# Patient Record
Sex: Female | Born: 1970
Health system: Southern US, Community
[De-identification: ages and names within clinical notes are randomized; demographics above are authoritative.]

## PROBLEM LIST (undated history)

## (undated) DIAGNOSIS — I10 Essential (primary) hypertension: Secondary | ICD-10-CM

## (undated) DIAGNOSIS — E0500 Thyrotoxicosis with diffuse goiter without thyrotoxic crisis or storm: Secondary | ICD-10-CM

## (undated) HISTORY — DX: Thyrotoxicosis with diffuse goiter without thyrotoxic crisis or storm: E05.00

---

## 1994-01-25 DIAGNOSIS — E0500 Thyrotoxicosis with diffuse goiter without thyrotoxic crisis or storm: Secondary | ICD-10-CM

## 1994-01-25 HISTORY — DX: Thyrotoxicosis with diffuse goiter without thyrotoxic crisis or storm: E05.00

## 1997-06-28 ENCOUNTER — Ambulatory Visit (HOSPITAL_COMMUNITY): Admission: RE | Admit: 1997-06-28 | Discharge: 1997-06-28 | Payer: Self-pay | Admitting: Endocrinology

## 1998-05-07 ENCOUNTER — Emergency Department (HOSPITAL_COMMUNITY): Admission: EM | Admit: 1998-05-07 | Discharge: 1998-05-07 | Payer: Self-pay | Admitting: Emergency Medicine

## 1999-03-11 ENCOUNTER — Emergency Department (HOSPITAL_COMMUNITY): Admission: EM | Admit: 1999-03-11 | Discharge: 1999-03-11 | Payer: Self-pay | Admitting: Emergency Medicine

## 2000-09-16 ENCOUNTER — Emergency Department (HOSPITAL_COMMUNITY): Admission: EM | Admit: 2000-09-16 | Discharge: 2000-09-16 | Payer: Self-pay | Admitting: Emergency Medicine

## 2002-02-13 ENCOUNTER — Encounter: Admission: RE | Admit: 2002-02-13 | Discharge: 2002-02-13 | Payer: Self-pay | Admitting: Family Medicine

## 2006-03-24 DIAGNOSIS — E669 Obesity, unspecified: Secondary | ICD-10-CM | POA: Insufficient documentation

## 2006-03-24 DIAGNOSIS — E0500 Thyrotoxicosis with diffuse goiter without thyrotoxic crisis or storm: Secondary | ICD-10-CM

## 2006-03-24 DIAGNOSIS — I10 Essential (primary) hypertension: Secondary | ICD-10-CM | POA: Insufficient documentation

## 2006-03-24 HISTORY — DX: Thyrotoxicosis with diffuse goiter without thyrotoxic crisis or storm: E05.00

## 2006-06-20 ENCOUNTER — Emergency Department (HOSPITAL_COMMUNITY): Admission: EM | Admit: 2006-06-20 | Discharge: 2006-06-20 | Payer: Self-pay | Admitting: Emergency Medicine

## 2006-11-27 ENCOUNTER — Emergency Department (HOSPITAL_COMMUNITY): Admission: EM | Admit: 2006-11-27 | Discharge: 2006-11-27 | Payer: Self-pay | Admitting: Emergency Medicine

## 2010-06-16 ENCOUNTER — Ambulatory Visit (HOSPITAL_BASED_OUTPATIENT_CLINIC_OR_DEPARTMENT_OTHER): Payer: Self-pay | Attending: Internal Medicine

## 2010-06-16 DIAGNOSIS — G4733 Obstructive sleep apnea (adult) (pediatric): Secondary | ICD-10-CM | POA: Insufficient documentation

## 2010-06-21 DIAGNOSIS — R0989 Other specified symptoms and signs involving the circulatory and respiratory systems: Secondary | ICD-10-CM

## 2010-06-21 DIAGNOSIS — G4733 Obstructive sleep apnea (adult) (pediatric): Secondary | ICD-10-CM

## 2010-06-21 DIAGNOSIS — R0609 Other forms of dyspnea: Secondary | ICD-10-CM

## 2010-06-22 NOTE — Procedures (Signed)
[name], [name] NO.:  [id number]  MEDICAL RECORD NO.:  [id number]          PATIENT TYPE:  OUT  LOCATION:  SLEEP CENTER                 FACILITY:  [facility]  PHYSICIAN:  Micheline Markes D. Maple Hudson, MD, FCCP, FACPDATE OF BIRTH:  1970/07/15  DATE OF STUDY:  06/16/2010                           NOCTURNAL POLYSOMNOGRAM  REFERRING PHYSICIAN:  EDWIN A AVBUERE  INDICATION FOR STUDY:  Hypersomnia with sleep apnea.  EPWORTH SLEEPINESS SCORE:  11/24, BMI 51.5.  Weight 300 pounds, height 64 inches.  Neck 16 inches.  MEDICATIONS:  Home medications are charted and reviewed.  SLEEP ARCHITECTURE:  Split study protocol.  During the diagnostic phase, total sleep time 117 minutes with sleep efficiency 73.4%.  Stage I was 27.4%, stage II was 62.8%, stage III was absent, REM 9.8% of total sleep time.  Sleep latency 8 minutes, REM latency 131.5 minutes, awake after sleep onset 34.5 minutes, arousal index 76.4.  Bedtime medication: None.  RESPIRATORY DATA:  Split study protocol.  Apnea-hypopnea index (AHI) 120.5 per hour.  A total of 235 events was scored including 185 obstructive apneas, one mixed apnea of 49 hypopneas.  Events were associated with non supine sleep.  REM AHI 120.  CPAP was then titrated to 15 CWP, AHI one per hour.  She wore a small ResMed Mirage Quattro full-face mask with heated humidifier and a C-Flex setting of 3.  OXYGEN DATA:  Before CPAP snoring was loud with oxygen desaturation to a nadir of 57% on room air.  With CPAP titration mean oxygen saturation was still only 89% with snoring prevented.  CARDIAC DATA:  Sinus rhythm with PVCs.  MOVEMENT-PARASOMNIA:  No significant movement disturbance.  Bathroom x1.  IMPRESSIONS-RECOMMENDATIONS: 1. Severe obstructive sleep apnea/hypopnea syndrome, apnea-hypopnea     index 120.5 per hour.  Non-supine events with loud snoring and     oxygen desaturation to a nadir of 57% on room air. 2. Successful continuous positive  airway pressure titration to 15 mL     centimeters of water pressure, apnea-hypopnea index 2 per hour.     She wore small ResMed Mirage Quattro full-face mask with heated     humidifier and C-Flex of 3. 3. Mean oxygen saturation while titrating CPAP remained low at 89.9%.     This was brought down by desaturation during apneas in the early     part of the titration phase.  Once the patient is established at     home on CPAP,     consider an overnight oximetry on continuous positive airway     pressure with room air to determine if oxygenation is adequate.     Wessley Emert D. Maple Hudson, MD, [facility], FACP Diplomate, Biomedical engineer of Sleep Medicine Electronically Signed    CDY/MEDQ  D:  06/21/2010 14:42:13  T:  06/22/2010 00:25:21  Job:  [id number]

## 2011-07-27 ENCOUNTER — Encounter: Payer: Self-pay | Admitting: Family Medicine

## 2011-07-27 ENCOUNTER — Ambulatory Visit (INDEPENDENT_AMBULATORY_CARE_PROVIDER_SITE_OTHER): Payer: Self-pay | Admitting: Family Medicine

## 2011-07-27 VITALS — BP 147/102 | HR 74 | Ht 64.0 in | Wt 320.7 lb

## 2011-07-27 DIAGNOSIS — E669 Obesity, unspecified: Secondary | ICD-10-CM

## 2011-07-27 DIAGNOSIS — I10 Essential (primary) hypertension: Secondary | ICD-10-CM

## 2011-07-27 DIAGNOSIS — G4730 Sleep apnea, unspecified: Secondary | ICD-10-CM

## 2011-07-27 DIAGNOSIS — E0500 Thyrotoxicosis with diffuse goiter without thyrotoxic crisis or storm: Secondary | ICD-10-CM

## 2011-07-27 LAB — COMPREHENSIVE METABOLIC PANEL
AST: 15 U/L (ref 0–37)
Albumin: 4.1 g/dL (ref 3.5–5.2)
BUN: 7 mg/dL (ref 6–23)
Calcium: 9.6 mg/dL (ref 8.4–10.5)
Chloride: 100 mEq/L (ref 96–112)
Glucose, Bld: 78 mg/dL (ref 70–99)
Potassium: 4.6 mEq/L (ref 3.5–5.3)

## 2011-07-27 LAB — CBC
Hemoglobin: 15.8 g/dL — ABNORMAL HIGH (ref 12.0–15.0)
MCHC: 33.8 g/dL (ref 30.0–36.0)

## 2011-07-27 MED ORDER — HYDROCHLOROTHIAZIDE 25 MG PO TABS: 25.0000 mg | ORAL_TABLET | Freq: Every day | ORAL | Status: DC

## 2011-07-27 NOTE — Patient Instructions (Addendum)
Dear Mrs. Stankowski,   Thank you for coming to clinic today. Please read below regarding the issues that we discussed.   1. Sleep Apnea - We may be able to get sponsorship through the vendor. I am getting your records from the Sleep Center and will then make a referral to [facility].   2. Graves Disease - We will check some labs today. This will help [place] to know if your thyroid is still working.   3. High Blood Pressure - Please start taking HCTZ 25 mg daily. Continue propranolol. We will re-check your blood pressure at the next visit.   Please follow up in clinic in 2 weeks . Please call earlier if you have any questions or concerns.   Sincerely,   Dr. Clinton Sawyer

## 2011-07-27 NOTE — Progress Notes (Signed)
  Subjective:    Patient ID: Molly White, female    DOB: January 21, 1971, 41 y.o.   MRN: [id number]  HPI  HTN - Currently taking HCTZ and propranolol; never missed a dose - recently prescribed by one time clinic  Graves Disease s/p Radioactive Ablation - never taken synthroid, was told that ablation was not completely effective  Sleep Apnea - diagnosed recently by [facility], not using CPAP 2/2 financial problems, frequent night time wakening and persistent daytime sleepiness, claims to have fallen asleep in a job interview  Goals of Care: 1. Care of thyroid 2. Care of sleep apnea - not using CPAP - Most important  3. Control Blood Pressure 4. Reduce Weight 5. Smoking Cessations - 0.5 ppd for 13-15 years; has tried several times to quit "cold-turkey"  Review of Systems Positive for daytime fatigue, polyuria, polydipsia  Negative for palpaitations, hair loss, tremulous    Objective:   Physical Exam BP 147/102  Pulse 74  Ht 5\' 4"  (1.626 m)  Wt 320 lb 11.2 oz (145.469 kg)  BMI 55.05 kg/m2 Gen: AAF, morbidly obese, alert and oriented, non distressed, very pleasant HEENT: Exophthalmos, no palpable goiter CV: RRR, distant heart sounds Lungs: CTA - B      Assessment & Plan:  41 y.o. F with uncontrolled HTN possible complicated by sleep apnea, obesity, and Graves disease.

## 2011-07-27 NOTE — Assessment & Plan Note (Signed)
Patient reported that she was diagnosed last year at [facility]. Obtain records. Fax referral to [facility] to see if vendor sponsorship for CPAP possible.

## 2011-07-27 NOTE — Assessment & Plan Note (Signed)
Likely primary and secondary HTN from sleep apnea. Increase HCTZ to 25 mg daily. Continue propranolol 80 mg daily for now. Recheck BP in 2 weeks.

## 2011-07-27 NOTE — Assessment & Plan Note (Addendum)
Diagnosed 1996. S/p radio ablation x 2. Patient told that ablation was not completely effective. Current thyroid status unknown as of 07/27/11. Check TSH, CMET, CBC.

## 2011-07-27 NOTE — Assessment & Plan Note (Signed)
Patient would like to lose weight. Discuss in further detail at upcoming visits.

## 2011-08-04 ENCOUNTER — Encounter: Payer: Self-pay | Admitting: Family Medicine

## 2011-08-04 ENCOUNTER — Other Ambulatory Visit: Payer: Self-pay | Admitting: Family Medicine

## 2011-08-04 DIAGNOSIS — G4733 Obstructive sleep apnea (adult) (pediatric): Secondary | ICD-10-CM

## 2011-10-27 ENCOUNTER — Other Ambulatory Visit: Payer: Self-pay | Admitting: Family Medicine

## 2012-02-03 ENCOUNTER — Other Ambulatory Visit: Payer: Self-pay | Admitting: *Deleted

## 2012-02-04 ENCOUNTER — Encounter: Payer: Self-pay | Admitting: Family Medicine

## 2012-02-04 ENCOUNTER — Ambulatory Visit (INDEPENDENT_AMBULATORY_CARE_PROVIDER_SITE_OTHER): Payer: Self-pay | Admitting: Family Medicine

## 2012-02-04 VITALS — BP 126/84 | HR 101 | Temp 98.0°F | Ht 64.0 in | Wt 333.0 lb

## 2012-02-04 DIAGNOSIS — E669 Obesity, unspecified: Secondary | ICD-10-CM

## 2012-02-04 DIAGNOSIS — G4730 Sleep apnea, unspecified: Secondary | ICD-10-CM

## 2012-02-04 DIAGNOSIS — I10 Essential (primary) hypertension: Secondary | ICD-10-CM

## 2012-02-04 MED ORDER — PROPRANOLOL HCL 80 MG PO TABS: 80.0000 mg | ORAL_TABLET | Freq: Every day | ORAL | Status: DC

## 2012-02-04 NOTE — Patient Instructions (Addendum)
NICE TO MEET YOU. I have refilled your propranolol. Will send you to nutrition referral. Do you best with starting exercise routine. 20-30 minutes with goal of 5 days per week. Please make appointment with Dr. Clinton Sawyer to check up on sleep apnea.

## 2012-02-06 NOTE — Progress Notes (Signed)
  Subjective:    Patient ID: Molly White, female    DOB: 1970-02-12, 42 y.o.   MRN: [id number]  HPI  1. HTN. Ran out of propranolol today and needs refill. Does not check BP at home consistently. Still has HCTZ.  2. Obesity. She understands her weight and sleep apnea are contributing, and is interested in trying new methods of weight loss bc she continues steady gain without much physical activity.  3. OSA. Using CPAP and feels her fatigue is improved. She requests to know if her readings/apneas have improved with the therapy. [facility] has not sent any compliance reports in Epic.  Review of Systems  Denies headaches, visual changes, rash, polyuria, polydipsia.No dyspnea, edema, chest pain, thyroid swelling, palpitations.     Objective:   Physical Exam  Vitals reviewed. Constitutional: She is oriented to person, place, and time. She appears well-nourished. No distress.       Morbid obese  HENT:  Head: Normocephalic and atraumatic.  Mouth/Throat: Oropharynx is clear and moist. No oropharyngeal exudate.  Eyes: EOM are normal. Pupils are equal, round, and reactive to light.  Neck: Normal range of motion. Neck supple. No thyromegaly present.  Cardiovascular: Normal rate, regular rhythm and normal heart sounds.   Pulmonary/Chest: Effort normal and breath sounds normal. No respiratory distress. She has no wheezes.  Musculoskeletal: She exhibits no edema.  Neurological: She is alert and oriented to person, place, and time. Coordination normal.  Skin: No rash noted. She is not diaphoretic.  Psychiatric: She has a normal mood and affect.        Assessment & Plan:

## 2012-02-06 NOTE — Assessment & Plan Note (Signed)
At goal today. Refilled propranolol. Patient prefers to wait for lab work, normal last visit so agree to postpone but needs glucose, TSH, cr next visit.

## 2012-02-06 NOTE — Assessment & Plan Note (Signed)
Subjective improvement with CPAP handled by [facility]. Perhaps we can get an update or compliance report from [facility] to have more details for patient with she follow up with PCP.

## 2012-02-06 NOTE — Assessment & Plan Note (Signed)
Deteriorated with gain of 13 lbs in past 6 months. Most likely from sedentary lifestyle and overeating. Will make nutrition referral. Encouraged starting exercise at least 20-30 minutes working up to 5 times weekly. F/u with PCP in 1-2 months.

## 2012-02-07 ENCOUNTER — Ambulatory Visit: Payer: Self-pay | Admitting: Family Medicine

## 2012-02-21 ENCOUNTER — Ambulatory Visit: Payer: Self-pay | Admitting: *Deleted

## 2012-04-24 ENCOUNTER — Other Ambulatory Visit: Payer: Self-pay | Admitting: Family Medicine

## 2012-04-25 ENCOUNTER — Other Ambulatory Visit: Payer: Self-pay | Admitting: *Deleted

## 2012-04-25 DIAGNOSIS — I10 Essential (primary) hypertension: Secondary | ICD-10-CM

## 2012-04-26 MED ORDER — PROPRANOLOL HCL 80 MG PO TABS: 80.0000 mg | ORAL_TABLET | Freq: Every day | ORAL | Status: DC

## 2012-04-28 ENCOUNTER — Other Ambulatory Visit: Payer: Self-pay | Admitting: Family Medicine

## 2012-06-22 ENCOUNTER — Ambulatory Visit (INDEPENDENT_AMBULATORY_CARE_PROVIDER_SITE_OTHER): Payer: BC Managed Care – PPO | Admitting: Family Medicine

## 2012-06-22 ENCOUNTER — Encounter: Payer: Self-pay | Admitting: Family Medicine

## 2012-06-22 DIAGNOSIS — M25572 Pain in left ankle and joints of left foot: Secondary | ICD-10-CM

## 2012-06-22 DIAGNOSIS — M25579 Pain in unspecified ankle and joints of unspecified foot: Secondary | ICD-10-CM

## 2012-06-22 DIAGNOSIS — E669 Obesity, unspecified: Secondary | ICD-10-CM

## 2012-06-22 NOTE — Progress Notes (Signed)
  Subjective:    Patient ID: Molly White, female    DOB: Mar 06, 1970, 42 y.o.   MRN: [id number]  HPI  42 year old F with morbid obesity and obstructive sleep apnea who presents for follow up on weight loss and left ankle pain.  Obesity:  Wt Readings from Last 5 Encounters:  06/22/12 326 lb (147.873 kg)  02/04/12 333 lb (151.048 kg)  07/27/11 320 lb 11.2 oz (145.469 kg)   History - Had been obese her entore adult life, had significant weight gain (60 lbs) when she became depressed after the death of her brother. He died of a heart attack in the post-operative setting after bariatric surgery. Therefore, the patient will not consider bariatric surgery as a weight loss option.   Ideal Body Weight: Per patient 200 lbs  Eating Habits -  Risky: 1 soda per day, really likes chocolate, eating after 8 PM  Healthy: really likes salad with kale, spinach, and fruits; avoiding carbohydrates at night, packing   24 hours food diary: Breakfast - yogurt, apple, 1-2 boiled eggs Lunch - soda (12 ounce regular), sandwich, fruit  Dinner - chicken, rice  Snacks - fruit, almonds, trail mix   Exercise - Patient was walking routinely before work, but did not like being outside alone at [place], so her walking has tapered off, will restart now that it is warmer and sunny outside  Left Ankle Pain: Location: left lateral ankle Duration: Since March, patient "turned" ankle while walking downstairs Course: no imaging or intervention at the time, pain has improved but still persistent Alleviating Factors: rest Exacerbating Factors: walking History of previous injury: no  Review of Systems Improved energy from sing CPAP      Objective:   Physical Exam BP 112/80  Pulse 80  Temp(Src) 98.6 F (37 C) (Oral)  Ht 5\' 4"  (1.626 m)  Wt 326 lb (147.873 kg)  BMI 55.93 kg/m2 Gen: AAF, appears stated aged, morbidly obese, very pleasant HEENT: NCAT, mild exophthalmos, short thick neck Left Ankle: normal  appearing with no bone deformities, mild trace edema around left lateral malleolus without ecchymosis, normal dorsal and plantar flexion, tenderness to palpation superior to malleoli      Assessment & Plan:  > 25 minutes spent with direct patient care and counseling

## 2012-06-22 NOTE — Patient Instructions (Addendum)
Thank you for coming to see me today.  Regarding weight loss - The thing that we discussed that you do is extremely well are to eat fruit, vegetables, nuts, eggs, and bringing your food to work. The things that we can work on are eating before 8:00 PM and reducing the Coke to 8 ounce instead of 12 ounce at lunch. I think it is reasonable to have 2-3 pound weight loss in the next month.   For the annual physical in July, come get your blood work done and then schedule an appointment with me 1 week later.   Take Care,   Dr. Clinton Sawyer

## 2012-06-25 DIAGNOSIS — M25572 Pain in left ankle and joints of left foot: Secondary | ICD-10-CM | POA: Insufficient documentation

## 2012-06-25 NOTE — Assessment & Plan Note (Signed)
Differential diagnosis includes ankle sprain vs fracture with sprain being most likely. However, I will obtain a left ankle X-ray to rule out fibula fracture given location of tenderness.

## 2012-06-25 NOTE — Assessment & Plan Note (Signed)
Patient has improved weight since last visit with 7 lb weight loss. Based upon the patient's desired weight of 250 pounds, I explained that bariatric surgery would likely be the most effective options, which she understandably refused due to her brother unfortunate death after a bariatric procedure. The patient has very good knowledge of healthy eating and very healthy eating habits that she shared with me. Additionally, she was able to recognize several modifiable eating habits to improve, specifically eating after 8PM and reducing the amount of soda from 12 ounces to 8 ounces daily. She also needs to improve her exercise regimen. Goal weight loss 2-3 pounds in next month. Patient in agreement with plan.

## 2012-07-13 ENCOUNTER — Telehealth: Payer: Self-pay | Admitting: Family Medicine

## 2012-07-13 NOTE — Telephone Encounter (Signed)
Patient has an irritation under her breast.  A friend let her try some of his prescription jock itch cream and it helped so she would like Dr. Clinton Sawyer to send her an Rx to [facility].

## 2012-07-13 NOTE — Telephone Encounter (Signed)
Pt told she needs an appt for evaluation.  Pt verbalized understanding.  Tymira Horkey, Darlyne Russian, CMA

## 2012-07-13 NOTE — Telephone Encounter (Signed)
Pt notified.  Leida Luton L, CMA  

## 2012-07-13 NOTE — Telephone Encounter (Signed)
Please tell patient that she can purchase over the counter medication for jock-itch that may work. So she should try that before she comes for a visit.

## 2012-09-21 ENCOUNTER — Other Ambulatory Visit: Payer: BC Managed Care – PPO

## 2012-09-21 LAB — CBC
HCT: 41 % (ref 36.0–46.0)
Hemoglobin: 14.1 g/dL (ref 12.0–15.0)
MCH: 31.4 pg (ref 26.0–34.0)
MCHC: 34.4 g/dL (ref 30.0–36.0)
RDW: 14 % (ref 11.5–15.5)

## 2012-09-21 LAB — LIPID PANEL
LDL Cholesterol: 130 mg/dL — ABNORMAL HIGH (ref 0–99)
VLDL: 24 mg/dL (ref 0–40)

## 2012-09-21 LAB — BASIC METABOLIC PANEL
CO2: 29 mEq/L (ref 19–32)
Chloride: 101 mEq/L (ref 96–112)
Potassium: 3.7 mEq/L (ref 3.5–5.3)
Sodium: 138 mEq/L (ref 135–145)

## 2012-09-21 NOTE — Progress Notes (Signed)
BMP,CBC,FLP AND TSH DONE TODAY Molly White

## 2012-09-22 ENCOUNTER — Encounter: Payer: Self-pay | Admitting: Family Medicine

## 2012-10-18 ENCOUNTER — Other Ambulatory Visit: Payer: Self-pay | Admitting: Family Medicine

## 2012-10-26 ENCOUNTER — Encounter: Payer: Self-pay | Admitting: Emergency Medicine

## 2012-10-26 ENCOUNTER — Telehealth: Payer: Self-pay | Admitting: Family Medicine

## 2012-10-26 ENCOUNTER — Ambulatory Visit (INDEPENDENT_AMBULATORY_CARE_PROVIDER_SITE_OTHER): Payer: BC Managed Care – PPO | Admitting: Emergency Medicine

## 2012-10-26 VITALS — BP 122/83 | HR 82 | Temp 98.1°F | Ht 64.0 in | Wt 306.0 lb

## 2012-10-26 DIAGNOSIS — J069 Acute upper respiratory infection, unspecified: Secondary | ICD-10-CM

## 2012-10-26 DIAGNOSIS — B372 Candidiasis of skin and nail: Secondary | ICD-10-CM

## 2012-10-26 DIAGNOSIS — F17200 Nicotine dependence, unspecified, uncomplicated: Secondary | ICD-10-CM

## 2012-10-26 DIAGNOSIS — Z720 Tobacco use: Secondary | ICD-10-CM | POA: Insufficient documentation

## 2012-10-26 MED ORDER — GUAIFENESIN ER 600 MG PO TB12: 1200.0000 mg | ORAL_TABLET | Freq: Two times a day (BID) | ORAL | Status: DC

## 2012-10-26 MED ORDER — CLOTRIMAZOLE 1 % EX CREA: TOPICAL_CREAM | Freq: Two times a day (BID) | CUTANEOUS | Status: DC

## 2012-10-26 MED ORDER — DOXYCYCLINE HYCLATE 100 MG PO TABS: 100.0000 mg | ORAL_TABLET | Freq: Two times a day (BID) | ORAL | Status: DC

## 2012-10-26 MED ORDER — AMOXICILLIN 875 MG PO TABS: 875.0000 mg | ORAL_TABLET | Freq: Two times a day (BID) | ORAL | Status: DC

## 2012-10-26 NOTE — Assessment & Plan Note (Signed)
Has not smoked since this started on Saturday. Encouraged continued cessation.

## 2012-10-26 NOTE — Patient Instructions (Addendum)
It was nice to meet you!  You likely caught a virus from one of your friends. Since you smoke and are having so much mucous, I sent in a prescription for an antibiotic called Doxycycline.  Take 1 pill twice a day for 10 days. Also take Mucinex 600mg  twice a day. Take a teaspoon of honey as needed to help with cough.  Use the clotrimazole cream on the affected areas twice a day.  Follow up with Dr. Clinton Sawyer as scheduled next week.

## 2012-10-26 NOTE — Telephone Encounter (Signed)
Will send in amoxicillin instead.  One pill twice a day for 10 days.

## 2012-10-26 NOTE — Assessment & Plan Note (Addendum)
Suspect viral given history and exam.  No red flags for pneumonia. Will give doxycycline given her smoking history and productive cough - a long the lines of COPD exacerbation. Discussed symptomatic treatment for cough with honey prn. Follow up as scheduled with Dr. Clinton Sawyer next week.

## 2012-10-26 NOTE — Assessment & Plan Note (Signed)
Under breasts and in groin. Likely secondary to body habitus. Discussed importance of keeping these areas dry. Clotrimazole cream BID until cleared.

## 2012-10-26 NOTE — Telephone Encounter (Signed)
Will fwd to MD.  Roxas Clymer L, CMA  

## 2012-10-26 NOTE — Telephone Encounter (Signed)
Pt received antibotic today but when she went to have ti filled it cost $42 with insurance She would like something cheaper. Please advise

## 2012-10-26 NOTE — Progress Notes (Signed)
  Subjective:    Patient ID: Molly White, female    DOB: 10/05/1970, 42 y.o.   MRN: [id number]  HPI Welda Azzarello is here for a SDA for cough.  Cough She reports a cough for the last 5 days.  It started with an itchy throat that progressed to a sore throat and cough.  Also with chest congestion.  Cough is productive of green mucous.  Coughing hard enough to cause some urinary stress incontinence.  Denies any nasal symptoms, fevers, shortness of breath or headaches.  Does endorse fatigue and decreased appetite.  No nausea, vomiting, diarrhea.  + sick contacts.  Rash She describes an itchy rash under her breasts and in her groin.  It has been present for at least 1 week.  Has had something similar in the past that cleared up with clotrimazole cream.  I have reviewed and updated the following as appropriate: allergies and current medications SHx: current smoker  Review of Systems See HPI    Objective:   Physical Exam BP 122/83  Pulse 82  Temp(Src) 98.1 F (36.7 C) (Oral)  Ht 5\' 4"  (1.626 m)  Wt 306 lb (138.801 kg)  BMI 52.5 kg/m2  SpO2 96% Gen: alert, cooperative, NAD HEENT: AT/[place], sclera white, MMM, no pharyngeal erythema or exudate, no nasal discharge Neck: supple, no LAD CV: RRR, no murmurs Pulm: good air movement; scattered rhonchi, no wheezing Skin: erythematous rash in skin folds under the breasts     Assessment & Plan:

## 2012-10-27 NOTE — Telephone Encounter (Signed)
Pt notified.  Palmina Clodfelter L, CMA  

## 2012-11-01 ENCOUNTER — Ambulatory Visit (INDEPENDENT_AMBULATORY_CARE_PROVIDER_SITE_OTHER): Payer: BC Managed Care – PPO | Admitting: Family Medicine

## 2012-11-01 ENCOUNTER — Encounter: Payer: Self-pay | Admitting: Family Medicine

## 2012-11-01 ENCOUNTER — Other Ambulatory Visit: Payer: Self-pay

## 2012-11-01 VITALS — BP 142/99 | HR 87 | Temp 98.3°F | Ht 64.0 in | Wt 310.0 lb

## 2012-11-01 DIAGNOSIS — E0500 Thyrotoxicosis with diffuse goiter without thyrotoxic crisis or storm: Secondary | ICD-10-CM

## 2012-11-01 DIAGNOSIS — Z720 Tobacco use: Secondary | ICD-10-CM

## 2012-11-01 DIAGNOSIS — E669 Obesity, unspecified: Secondary | ICD-10-CM

## 2012-11-01 DIAGNOSIS — Z1231 Encounter for screening mammogram for malignant neoplasm of breast: Secondary | ICD-10-CM

## 2012-11-01 DIAGNOSIS — F17200 Nicotine dependence, unspecified, uncomplicated: Secondary | ICD-10-CM

## 2012-11-01 DIAGNOSIS — Z23 Encounter for immunization: Secondary | ICD-10-CM

## 2012-11-01 MED ORDER — TETANUS-DIPHTH-ACELL PERTUSSIS 5-2.5-18.5 LF-MCG/0.5 IM SUSP: 0.5000 mL | Freq: Once | INTRAMUSCULAR | Status: DC

## 2012-11-01 NOTE — Patient Instructions (Signed)
Dear Molly White,   Thank you for coming to clinic today. Please read below regarding the issues that we discussed.   1. Smoking - I am glad you're planning to quit smoking. Please consider using nicotine replacement or medication if necessary. Also suggested he call the quit line. I'll give you the contact information.  2. Cervical Cancer Screening - Please followup in the next few weeks for a Pap smear.  3. Weight Loss - Way to go! Keep up the exercise. I am very proud of you.   4. Lab Results - As I mentioned, everything was normal.   5. Mammogram - Please call the breast center to set up a mammogram.   Please follow up in clinic in 2 weeks. Please call earlier if you have any questions or concerns.   Sincerely,   Dr. Clinton Sawyer

## 2012-11-02 NOTE — Progress Notes (Signed)
  Subjective:    Patient ID: Molly White, female    DOB: 1970/03/31, 42 y.o.   MRN: [id number]  HPI  Patient presents for follow up of recent lab work from 09/21/12. Specifically, we reviewed CBC, BMP, Lipid Profile and TSH and significance of each lab. No labs warranted further evaluation or treatment.   Obesity - Patient attempting to lose weight through exercise. Improvements noted below. Patient very pleased with progress.   Wt Readings from Last 5 Encounters:  11/01/12 310 lb (140.615 kg)  10/26/12 306 lb (138.801 kg)  06/22/12 326 lb (147.873 kg)  02/04/12 333 lb (151.048 kg)  07/27/11 320 lb 11.2 oz (145.469 kg)    Tobacco Use - patient currently smoking 1/4 - 1/2 ppd. She would like to quit. 10/10 on scale of importance and 5/10 scale of confidence that she can quit; no quit date set; strategy is reduction in number until she stops completely; no hx of other interventions like couseling, nicotine replacement, and medication  Review of Systems Negative for cough, fever, chest pain, SOB    Objective:   Physical Exam BP 142/99  Pulse 87  Temp(Src) 98.3 F (36.8 C) (Oral)  Ht 5\' 4"  (1.626 m)  Wt 310 lb (140.615 kg)  BMI 53.19 kg/m2  SpO2 94% Gen: morbidly obese AAF, pleasant and conversant CV: RRR, no murmurs rubs or gallops Pulm: normal WOB CTA-B       Assessment & Plan:

## 2012-11-03 NOTE — Assessment & Plan Note (Signed)
Counseled on importance of choosing a quit strategy and quit date. Also given information about [facility] QUIT Line and will contact them.

## 2012-11-03 NOTE — Assessment & Plan Note (Signed)
TSH WNL so no evidence of hyperthyroidism in labs or clinically. Continue current course.

## 2012-11-03 NOTE — Assessment & Plan Note (Signed)
Assessment: still morbidly obese but improved since May by 16 lbs Plan: Continue regular exercise for goal weight loss of 1-2lbs per month

## 2012-11-22 ENCOUNTER — Ambulatory Visit
Admission: RE | Admit: 2012-11-22 | Discharge: 2012-11-22 | Disposition: A | Discharge status: Home / Self Care | Payer: BC Managed Care – PPO | Source: Ambulatory Visit

## 2012-11-22 DIAGNOSIS — Z1231 Encounter for screening mammogram for malignant neoplasm of breast: Secondary | ICD-10-CM

## 2012-12-29 ENCOUNTER — Ambulatory Visit (INDEPENDENT_AMBULATORY_CARE_PROVIDER_SITE_OTHER): Payer: BC Managed Care – PPO | Admitting: Family Medicine

## 2012-12-29 ENCOUNTER — Encounter: Payer: Self-pay | Admitting: Family Medicine

## 2012-12-29 VITALS — BP 129/74 | HR 79 | Temp 99.7°F | Ht 64.0 in | Wt 308.0 lb

## 2012-12-29 DIAGNOSIS — J019 Acute sinusitis, unspecified: Secondary | ICD-10-CM

## 2012-12-29 MED ORDER — AZITHROMYCIN 250 MG PO TABS: ORAL_TABLET | ORAL | Status: DC

## 2012-12-29 MED ORDER — FLUTICASONE PROPIONATE 50 MCG/ACT NA SUSP: 2.0000 | Freq: Every day | NASAL | Status: DC

## 2012-12-29 NOTE — Progress Notes (Signed)
Patient ID: Molly White, female   DOB: Apr 11, 1970, 42 y.o.   MRN: [id number]  Kevin Fenton, MD Phone: [phone]  Subjective:  Chief complaint-noted  # Sinus pressure  42 year old female presents to the clinic for 10-14 days of congestion, face pain, headache, and nausea. He states that she was seen the beginning of October for the same thing, that time she had thick green sputum which has resolved. She is concerned because since September she feels like she's had a couple weeks of sinus pressure, face pain, and congestion around the end of each month. She notes that over Thanksgiving he got much worse and she lost her sense of taste and smell for 2-3 days. She denies fever, chills, sweats She endorses recent fatigue, stating that the past several weeks she's gotten sleepy around noon rim the same place each day. She's had no syncopal episodes but does feel like she may pass out at times. These events come on gradually.  ROS- No dyspnea No chest pain C/o ear congestion/pressure also Positive nausea, no vomiting or diarrhea No new swelling Using her CPAP daily  Past Medical History Patient Active Problem List   Diagnosis Date Noted  . Acute sinusitis 12/29/2012  . Candidiasis, intertrigo 10/26/2012  . Tobacco abuse 10/26/2012  . Left lateral ankle pain 06/25/2012  . Sleep apnea 07/27/2011  . GRAVES DISEASE 03/24/2006  . OBESITY, NOS 03/24/2006  . HYPERTENSION, BENIGN SYSTEMIC 03/24/2006    Medications- reviewed and updated Current Outpatient Prescriptions  Medication Sig Dispense Refill  . amoxicillin (AMOXIL) 875 MG tablet Take 1 tablet (875 mg total) by mouth 2 (two) times daily.  20 tablet  0  . azithromycin (ZITHROMAX) 250 MG tablet Take 2 pills on the first day and 1 pill daily after that  6 tablet  0  . clotrimazole (LOTRIMIN) 1 % cream Apply topically 2 (two) times daily.  30 g  0  . fluticasone (FLONASE) 50 MCG/ACT nasal spray Place 2 sprays into both nostrils  daily.  16 g  6  . guaiFENesin (MUCINEX) 600 MG 12 hr tablet Take 2 tablets (1,200 mg total) by mouth 2 (two) times daily.  20 tablet  0  . hydrochlorothiazide (HYDRODIURIL) 25 MG tablet TAKE 1 TABLET BY MOUTH ONCE DAILY  30 tablet  5  . propranolol (INDERAL) 80 MG tablet Take 1 tablet (80 mg total) by mouth daily.  90 tablet  3   No current facility-administered medications for this visit.    Objective: BP 129/74  Pulse 79  Temp(Src) 99.7 F (37.6 C) (Oral)  Ht 5\' 4"  (1.626 m)  Wt 308 lb (139.708 kg)  BMI 52.84 kg/m2 Gen: NAD, alert, cooperative with exam HEENT: NCAT, EOMI, PERRL, sclera white, no eye dc, R turbinate swollen and boggy, TMs WNL BL, R maxillary sinus tenderness CV: RRR, good S1/S2, no murmur Resp: CTABL, no wheezes, non-labored Abd: SNTND, BS present, no guarding or organomegaly, obese   Assessment/Plan:  Acute sinusitis Will treat as bacterikla given fatigue, sinus pain on exam, and duration of symptoms Azithro sent Discussed that its likely recurring bc of her nasal inflammation is likely not allowing her sinuses to drain, she has a good story for post nasal drip as well Discussed netii pot or nasal saline use at length, also given Rx for Flonase Follow up with PCP if symptoms do not improve.      Meds ordered this encounter  Medications  . fluticasone (FLONASE) 50 MCG/ACT nasal spray    Sig: Place  2 sprays into both nostrils daily.    Dispense:  16 g    Refill:  6  . azithromycin (ZITHROMAX) 250 MG tablet    Sig: Take 2 pills on the first day and 1 pill daily after that    Dispense:  6 tablet    Refill:  0

## 2012-12-29 NOTE — Assessment & Plan Note (Signed)
Will treat as bacterikla given fatigue, sinus pain on exam, and duration of symptoms Azithro sent Discussed that its likely recurring bc of her nasal inflammation is likely not allowing her sinuses to drain, she has a good story for post nasal drip as well Discussed netii pot or nasal saline use at length, also given Rx for Flonase Follow up with PCP if symptoms do not improve.

## 2012-12-29 NOTE — Patient Instructions (Signed)
It was great to meet you!  Remember to try the neti pot or nasal saline ( I think the neti pot is best)  Use the flonase once daily after the neti pot Be sure to finish your antibiotics

## 2013-01-09 ENCOUNTER — Ambulatory Visit (INDEPENDENT_AMBULATORY_CARE_PROVIDER_SITE_OTHER): Payer: BC Managed Care – PPO | Admitting: Family Medicine

## 2013-01-09 ENCOUNTER — Encounter: Payer: Self-pay | Admitting: Family Medicine

## 2013-01-09 VITALS — BP 109/75 | HR 80 | Temp 97.9°F | Ht 64.0 in | Wt 308.4 lb

## 2013-01-09 DIAGNOSIS — H5789 Other specified disorders of eye and adnexa: Secondary | ICD-10-CM

## 2013-01-09 DIAGNOSIS — H109 Unspecified conjunctivitis: Secondary | ICD-10-CM

## 2013-01-09 DIAGNOSIS — H579 Unspecified disorder of eye and adnexa: Secondary | ICD-10-CM

## 2013-01-09 MED ORDER — EYE LUBRICANT OP OINT: 0.5000 [in_us] | TOPICAL_OINTMENT | Freq: Every day | OPHTHALMIC | Status: DC

## 2013-01-09 NOTE — Patient Instructions (Signed)
Like we talked about, this is likely due to dry eye and inability of the lid to close. I am sending an over-the-counter lubricant ointment to put on every night. You may also want to consider taking your eyes checked. I am going to send a referral to an eye doctor for them to evaluate you in the setting of I is not closing.   Allergic Conjunctivitis The conjunctiva is a thin membrane that covers the visible white part of the eyeball and the underside of the eyelids. This membrane protects and lubricates the eye. The membrane has small blood vessels running through it that can normally be seen. When the conjunctiva becomes inflamed, the condition is called conjunctivitis. In response to the inflammation, the conjunctival blood vessels become swollen. The swelling results in redness in the normally white part of the eye. The blood vessels of this membrane also react when a person has allergies and is then called allergic conjunctivitis. This condition usually lasts for as long as the allergy persists. Allergic conjunctivitis cannot be passed to another person (non-contagious). The likelihood of bacterial infection is great and the cause is not likely due to allergies if the inflamed eye has:  A sticky discharge.  Discharge or sticking together of the lids in the morning.  Scaling or flaking of the eyelids where the eyelashes come out.  Red swollen eyelids. CAUSES   Viruses.  Irritants such as foreign bodies.  Chemicals.  General allergic reactions.  Inflammation or serious diseases in the inside or the outside of the eye or the orbit (the boney cavity in which the eye sits) can cause a "red eye." SYMPTOMS   Eye redness.  Tearing.  Itchy eyes.  Burning feeling in the eyes.  Clear drainage from the eye.  Allergic reaction due to pollens or ragweed sensitivity. Seasonal allergic conjunctivitis is frequent in the spring when pollens are in the air and in the fall. DIAGNOSIS  This  condition, in its many forms, is usually diagnosed based on the history and an ophthalmological exam. It usually involves both eyes. If your eyes react at the same time every year, allergies may be the cause. While most "red eyes" are due to allergy or an infection, the role of an eye (ophthalmological) exam is important. The exam can rule out serious diseases of the eye or orbit. TREATMENT   Non-antibiotic eye drops, ointments, or medications by mouth may be prescribed if the ophthalmologist is sure the conjunctivitis is due to allergies alone.  Over-the-counter drops and ointments for allergic symptoms should be used only after other causes of conjunctivitis have been ruled out, or as your caregiver suggests. Medications by mouth are often prescribed if other allergy-related symptoms are present. If the ophthalmologist is sure that the conjunctivitis is due to allergies alone, treatment is normally limited to drops or ointments to reduce itching and burning. HOME CARE INSTRUCTIONS   Wash hands before and after applying drops or ointments, or touching the inflamed eye(s) or eyelids.  Do not let the eye dropper tip or ointment tube touch the eyelid when putting medicine in your eye.  Stop using your soft contact lenses and throw them away. Use a new pair of lenses when recovery is complete. You should run through sterilizing cycles at least three times before use after complete recovery if the old soft contact lenses are to be used. Hard contact lenses should be stopped. They need to be thoroughly sterilized before use after recovery.  Itching and burning eyes  due to allergies is often relieved by using a cool cloth applied to closed eye(s). SEEK MEDICAL CARE IF:   Your problems do not go away after two or three days of treatment.  Your lids are sticky (especially in the morning when you wake up) or stick together.  Discharge develops. Antibiotics may be needed either as drops, ointment, or by  mouth.  You have extreme light sensitivity.  An oral temperature above 102 F (38.9 C) develops.  Pain in or around the eye or any other visual symptom develops. MAKE SURE YOU:   Understand these instructions.  Will watch your condition.  Will get help right away if you are not doing well or get worse. Document Released: 04/03/2002 Document Revised: 04/05/2011 Document Reviewed: 02/27/2007 [facility] Patient Information 2014 [place], [place].

## 2013-01-10 DIAGNOSIS — H109 Unspecified conjunctivitis: Secondary | ICD-10-CM | POA: Insufficient documentation

## 2013-01-10 NOTE — Assessment & Plan Note (Signed)
No evidence of bacterial conjunctivitis (no purulent drainage or fever). Less likely to be viral conjunctivitis.  No evidence of corneal abrasion on Wood's lamp exam.  Given her exophthalmos and inability to completely close her eyes, she may have developed non allergic conjunctivitis from dry eyes.  - lubricant ointment to apply at night time - referral to ophthalmology for evaluation of exophthalmos

## 2013-01-10 NOTE — Progress Notes (Signed)
Patient ID: Molly White    DOB: 22-Jul-1970, 42 y.o.   MRN: [id number] --- Subjective:  Molly White is a 42 y.o.female who presents as same day patient for evaluation of pink eye.  - symptoms started on Saturday. Started in the left eye. She reports redness and the feeling of a grain of sand in the middle of her eye. She states that the left eye might be slightly more blurry than the right. She denies any eye pain. She denies any discharge or crustiness. She now feels like her right eye is also getting red. She denies any trauma to the eye or any dust flying into the eye. She has had some congestion and cough, but this is resolving.   ROS: see HPI Past Medical History: reviewed and updated medications and allergies. Social History: Tobacco: current every day smoker  Objective: Filed Vitals:   01/09/13 1435  BP: 109/75  Pulse: 80  Temp: 97.9 F (36.6 C)    Physical Examination:   General appearance - alert, well appearing, and in no distress Ears - bilateral TM's and external ear canals normal Nose - mildly erythematous and congested bilaterally Mouth - mucous membranes moist, pharynx normal without lesions Neck - supple, no significant adenopathy Eye exam - left palpebral conjunctiva is injected compared to right, exophthalmos is present, lids do not completely close Fluorescein exam: no corneal abrasion or ulcerations noted

## 2013-02-19 ENCOUNTER — Other Ambulatory Visit: Payer: Self-pay | Admitting: Emergency Medicine

## 2013-04-18 ENCOUNTER — Other Ambulatory Visit: Payer: Self-pay | Admitting: Family Medicine

## 2013-06-27 ENCOUNTER — Telehealth: Payer: Self-pay | Admitting: Family Medicine

## 2013-06-27 NOTE — Telephone Encounter (Signed)
Will forward message to Henderson Newcomer at [facility], please fax new request for CPAP mask to Dr Clinton Sawyer.Doris Cheadle Sahid Borba

## 2013-06-27 NOTE — Telephone Encounter (Signed)
Please call [facility], who supplies her CPAP supplies, and ask them to fax a request for a new mask. Thank you.

## 2013-06-27 NOTE — Telephone Encounter (Signed)
Pt called and needs a new mask for her CPAP machine. jw

## 2013-06-28 ENCOUNTER — Other Ambulatory Visit: Payer: Self-pay | Admitting: Family Medicine

## 2013-06-28 DIAGNOSIS — G4733 Obstructive sleep apnea (adult) (pediatric): Secondary | ICD-10-CM

## 2013-10-22 ENCOUNTER — Other Ambulatory Visit: Payer: Self-pay | Admitting: *Deleted

## 2013-10-22 NOTE — Telephone Encounter (Signed)
Pt called and needs refills on her Propranolol and hydrochlorothiazide sent to the pharmacy. jw

## 2013-10-23 MED ORDER — PROPRANOLOL HCL 80 MG PO TABS: ORAL_TABLET | ORAL | Status: DC

## 2013-10-23 MED ORDER — HYDROCHLOROTHIAZIDE 25 MG PO TABS: ORAL_TABLET | ORAL | Status: DC

## 2013-10-23 NOTE — Telephone Encounter (Signed)
Spoke with patient and ionfromed her that Dr. Caleb PoppNettey refilled rx request for 1 month and that patient needs to make an appointment before any future refills are made. Patient understands.

## 2013-11-08 ENCOUNTER — Telehealth: Payer: Self-pay | Admitting: Family Medicine

## 2013-11-08 NOTE — Telephone Encounter (Signed)
Pt scheduled an appt for 11/30/13 to get refills on her meds, says she will run out of hctz and propanolol before the 6th, wants to know if MD will refill until her appt?

## 2013-11-10 MED ORDER — HYDROCHLOROTHIAZIDE 25 MG PO TABS: ORAL_TABLET | ORAL | Status: DC

## 2013-11-10 MED ORDER — PROPRANOLOL HCL 80 MG PO TABS: ORAL_TABLET | ORAL | Status: DC

## 2013-11-10 NOTE — Telephone Encounter (Signed)
Medication refill sent to pharmacy  

## 2013-11-12 ENCOUNTER — Telehealth: Payer: Self-pay | Admitting: Family Medicine

## 2013-11-12 NOTE — Telephone Encounter (Signed)
Pt returned Sara's call and was told about the medication refill. jw

## 2013-11-12 NOTE — Telephone Encounter (Signed)
LVM for patient to call back. ?

## 2013-11-29 ENCOUNTER — Other Ambulatory Visit: Payer: Self-pay | Admitting: Family Medicine

## 2013-11-29 NOTE — Telephone Encounter (Signed)
Needs refill on HCTZ and propanol  She has an appt 11-30. It was Monday 11-6- but she had to reschedule because she had to go out of town Please advise

## 2013-11-30 ENCOUNTER — Ambulatory Visit: Payer: Self-pay | Admitting: Family Medicine

## 2013-11-30 MED ORDER — HYDROCHLOROTHIAZIDE 25 MG PO TABS: ORAL_TABLET | ORAL | Status: DC

## 2013-11-30 MED ORDER — PROPRANOLOL HCL 80 MG PO TABS: ORAL_TABLET | ORAL | Status: DC

## 2013-12-24 ENCOUNTER — Ambulatory Visit: Payer: Self-pay | Admitting: Family Medicine

## 2014-01-22 ENCOUNTER — Encounter: Payer: Self-pay | Admitting: Family Medicine

## 2014-01-22 ENCOUNTER — Ambulatory Visit (INDEPENDENT_AMBULATORY_CARE_PROVIDER_SITE_OTHER): Payer: Self-pay | Admitting: Family Medicine

## 2014-01-22 VITALS — BP 128/78 | HR 72 | Ht 64.0 in | Wt 308.8 lb

## 2014-01-22 DIAGNOSIS — N3946 Mixed incontinence: Secondary | ICD-10-CM

## 2014-01-22 DIAGNOSIS — I10 Essential (primary) hypertension: Secondary | ICD-10-CM

## 2014-01-22 DIAGNOSIS — E0500 Thyrotoxicosis with diffuse goiter without thyrotoxic crisis or storm: Secondary | ICD-10-CM

## 2014-01-22 MED ORDER — HYDROCHLOROTHIAZIDE 25 MG PO TABS: ORAL_TABLET | ORAL | Status: DC

## 2014-01-22 MED ORDER — PROPRANOLOL HCL 80 MG PO TABS: ORAL_TABLET | ORAL | Status: DC

## 2014-01-22 NOTE — Patient Instructions (Signed)
Thank you for coming to see me today. It was a pleasure. Today we talked about:   High blood pressure: Your blood pressure is at goal today. Great job. No changes to your medication. Your HCTZ has been refilled  Hyperthyroidism: I will refill your propranolol. Please remember to ask the pharmacy for the generic since that is your preference  Urine incontinence: this is probably related to weakened urethral muscles. I am including some exercises for you to try. I hope this helps  Please make an appointment to see me in 4 weeks for your yearly physical exam .  If you have any questions or concerns, please do not hesitate to call the office at [phone].  Sincerely,  Jacquelin Hawkingalph Atticus Lemberger, MD

## 2014-01-22 NOTE — Progress Notes (Signed)
    Subjective    Molly White is a 43 y.o. female that presents for an office visit.   1. Hyperthyroidism: Patient has fluttering at times but not often. Asymptomatic with those episodes and they last a few seconds. No tremors. Adherent with propranolol  2. Hypertension: No headaches, chest pain or shortness of breath. Adherent with HCTZ.  3. Urine incontinence: Symptoms started about a year ago. Incontinence with coughing and sneezing. Has associated urgency. No dysuria.  History  Substance Use Topics  . Smoking status: Current Every Day Smoker -- 0.30 packs/day for 15 years    Types: Cigarettes  . Smokeless tobacco: Not on file  . Alcohol Use: Not on file    No Known Allergies  No orders of the defined types were placed in this encounter.    ROS  Per HPI   Objective   BP 128/78 mmHg  Pulse 72  Ht 5\' 4"  (1.626 m)  Wt 308 lb 12.8 oz (140.071 kg)  BMI 52.98 kg/m2  General: Well appearing, no distress  Assessment and Plan   Please refer to problem based charting of assessment and plan

## 2014-01-23 DIAGNOSIS — N3946 Mixed incontinence: Secondary | ICD-10-CM | POA: Insufficient documentation

## 2014-01-23 NOTE — Assessment & Plan Note (Signed)
Blood pressure controlled.  Refill hydrochlorothiazide

## 2014-01-23 NOTE — Assessment & Plan Note (Signed)
No symptoms of hyperthyroid except for infrequent asymptomatic palpitations lasting seconds. Adherent with medications.  Propranolol refilled  TSH at next visit

## 2014-01-23 NOTE — Assessment & Plan Note (Signed)
   Information for Kegel exercises given to patient

## 2014-05-21 ENCOUNTER — Ambulatory Visit: Payer: Self-pay | Admitting: Family Medicine

## 2014-07-25 ENCOUNTER — Ambulatory Visit: Payer: Self-pay | Admitting: Family Medicine

## 2014-08-02 ENCOUNTER — Ambulatory Visit: Payer: Self-pay | Admitting: Family Medicine

## 2015-01-15 ENCOUNTER — Telehealth: Payer: Self-pay | Admitting: Family Medicine

## 2015-01-15 NOTE — Telephone Encounter (Signed)
Needs refill on HCTZ and propenanal.  Has appt jan 3 [facility] [address]

## 2015-01-16 MED ORDER — HYDROCHLOROTHIAZIDE 25 MG PO TABS: ORAL_TABLET | ORAL | Status: DC

## 2015-01-16 MED ORDER — PROPRANOLOL HCL 80 MG PO TABS: ORAL_TABLET | ORAL | Status: DC

## 2015-01-16 NOTE — Telephone Encounter (Signed)
Pt informed and agreeable. Ostin Mathey Dawn  

## 2015-01-16 NOTE — Telephone Encounter (Signed)
30 day supply given. Patient will need to keep appointment for future refills

## 2015-01-16 NOTE — Addendum Note (Signed)
Addended by: Jacquelin HawkingNETTEY, Chelsa Stout A on: 01/16/2015 12:50 AM   Modules accepted: Orders

## 2015-01-21 MED ORDER — PROPRANOLOL HCL 80 MG PO TABS: ORAL_TABLET | ORAL | Status: DC

## 2015-01-21 MED ORDER — HYDROCHLOROTHIAZIDE 25 MG PO TABS: ORAL_TABLET | ORAL | Status: DC

## 2015-01-21 NOTE — Telephone Encounter (Signed)
Medication resent to [facility] per patient request.  Amarien Carne L, RN  

## 2015-01-21 NOTE — Telephone Encounter (Signed)
Pt calling as this medication was supposed to be sent to [facility] on [address]. Please correct and inform pt. Thank you, Dorothey BasemanSadie Reynolds, ASA

## 2015-01-21 NOTE — Addendum Note (Signed)
Addended by: Clovis PuMARTIN, Michal Callicott L on: 01/21/2015 11:22 AM   Modules accepted: Orders

## 2015-01-28 ENCOUNTER — Encounter: Payer: Self-pay | Admitting: Family Medicine

## 2015-02-05 ENCOUNTER — Other Ambulatory Visit (HOSPITAL_COMMUNITY)
Admission: RE | Admit: 2015-02-05 | Discharge: 2015-02-05 | Disposition: A | Discharge status: Home / Self Care | Payer: BLUE CROSS/BLUE SHIELD | Source: Ambulatory Visit | Attending: Family Medicine | Admitting: Family Medicine

## 2015-02-05 ENCOUNTER — Encounter: Payer: Self-pay | Admitting: Family Medicine

## 2015-02-05 ENCOUNTER — Ambulatory Visit (INDEPENDENT_AMBULATORY_CARE_PROVIDER_SITE_OTHER): Payer: BLUE CROSS/BLUE SHIELD | Admitting: Family Medicine

## 2015-02-05 VITALS — BP 122/69 | HR 69 | Temp 98.2°F | Wt 305.2 lb

## 2015-02-05 DIAGNOSIS — Z114 Encounter for screening for human immunodeficiency virus [HIV]: Secondary | ICD-10-CM

## 2015-02-05 DIAGNOSIS — Z01411 Encounter for gynecological examination (general) (routine) with abnormal findings: Secondary | ICD-10-CM | POA: Insufficient documentation

## 2015-02-05 DIAGNOSIS — R739 Hyperglycemia, unspecified: Secondary | ICD-10-CM

## 2015-02-05 DIAGNOSIS — E0500 Thyrotoxicosis with diffuse goiter without thyrotoxic crisis or storm: Secondary | ICD-10-CM | POA: Diagnosis not present

## 2015-02-05 DIAGNOSIS — Z124 Encounter for screening for malignant neoplasm of cervix: Secondary | ICD-10-CM

## 2015-02-05 DIAGNOSIS — I10 Essential (primary) hypertension: Secondary | ICD-10-CM

## 2015-02-05 DIAGNOSIS — Z23 Encounter for immunization: Secondary | ICD-10-CM | POA: Diagnosis not present

## 2015-02-05 DIAGNOSIS — Z1151 Encounter for screening for human papillomavirus (HPV): Secondary | ICD-10-CM | POA: Diagnosis not present

## 2015-02-05 LAB — CBC WITH DIFFERENTIAL/PLATELET
BASOS ABS: 0 10*3/uL (ref 0.0–0.1)
BASOS PCT: 0 % (ref 0–1)
Eosinophils Absolute: 0.2 10*3/uL (ref 0.0–0.7)
Eosinophils Relative: 4 % (ref 0–5)
HCT: 42.7 % (ref 36.0–46.0)
Hemoglobin: 14.4 g/dL (ref 12.0–15.0)
Lymphocytes Relative: 50 % — ABNORMAL HIGH (ref 12–46)
Lymphs Abs: 2.6 10*3/uL (ref 0.7–4.0)
MCH: 30.9 pg (ref 26.0–34.0)
MCHC: 33.7 g/dL (ref 30.0–36.0)
MCV: 91.6 fL (ref 78.0–100.0)
MONO ABS: 0.5 10*3/uL (ref 0.1–1.0)
MPV: 11.1 fL (ref 8.6–12.4)
Monocytes Relative: 9 % (ref 3–12)
NEUTROS ABS: 1.9 10*3/uL (ref 1.7–7.7)
Neutrophils Relative %: 37 % — ABNORMAL LOW (ref 43–77)
Platelets: 210 10*3/uL (ref 150–400)
RBC: 4.66 MIL/uL (ref 3.87–5.11)
RDW: 13.5 % (ref 11.5–15.5)
WBC: 5.2 10*3/uL (ref 4.0–10.5)

## 2015-02-05 LAB — COMPLETE METABOLIC PANEL WITH GFR
ALT: 17 U/L (ref 6–29)
AST: 18 U/L (ref 10–30)
Albumin: 4.1 g/dL (ref 3.6–5.1)
Alkaline Phosphatase: 46 U/L (ref 33–115)
BUN: 15 mg/dL (ref 7–25)
CHLORIDE: 99 mmol/L (ref 98–110)
CO2: 30 mmol/L (ref 20–31)
Calcium: 9.9 mg/dL (ref 8.6–10.2)
Creat: 0.8 mg/dL (ref 0.50–1.10)
GFR, Est African American: >89 mL/min (ref >=60)
GFR, Est Non African American: >89 mL/min (ref >=60)
Glucose, Bld: 95 mg/dL (ref 65–99)
POTASSIUM: 3.9 mmol/L (ref 3.5–5.3)
Sodium: 137 mmol/L (ref 135–146)
Total Bilirubin: 0.3 mg/dL (ref 0.2–1.2)
Total Protein: 6.9 g/dL (ref 6.1–8.1)

## 2015-02-05 LAB — POCT GLYCOSYLATED HEMOGLOBIN (HGB A1C): Hemoglobin A1C: 5.6

## 2015-02-05 LAB — LDL CHOLESTEROL, DIRECT: LDL DIRECT: 135 mg/dL — AB (ref <130)

## 2015-02-05 MED ORDER — PROPRANOLOL HCL 80 MG PO TABS: ORAL_TABLET | ORAL | Status: DC

## 2015-02-05 MED ORDER — FLUTICASONE PROPIONATE 50 MCG/ACT NA SUSP: 2.0000 | Freq: Every day | NASAL | Status: DC

## 2015-02-05 NOTE — Patient Instructions (Signed)
Thank you for coming to see me today. It was a pleasure. Today we talked about:   I will get your results to you via mail. Please follow-up with me within the month to talk about your other chronic issues.  If you have any questions or concerns, please do not hesitate to call the office at [phone].  Sincerely,  Jacquelin Hawkingalph Lauralie Blacksher, MD

## 2015-02-05 NOTE — Progress Notes (Signed)
Subjective    Rowene Suto is a 45 y.o. female that presents for yearly physical exam.   Concerns:  1. None  Goals    . Blood Pressure < 140/90       No obstetric history on file. Wt Readings from Last 3 Encounters:  02/05/15 305 lb 3.2 oz (138.438 kg)  01/22/14 308 lb 12.8 oz (140.071 kg)  01/09/13 308 lb 6.4 oz (139.889 kg)   Last period:   Regular periods: No Heavy bleeding: No  Sexually active: No Birth control or hormonal therapy: Yes, IUD Hx of STD: Patient desires STD screening Dyspareunia: No Hot flashes: No Vaginal discharge: No Dysuria: No  Last mammogram: 2015 Breast mass or concerns: No Last Pap: Possibly 5 years ago  History of abnormal pap: No   FH of breast, uterine, ovarian, colon cancer: yes  Past Medical History  Diagnosis Date  . GRAVES DISEASE 03/24/2006    Diagnosed 1996. S/p radio ablation x 2. Patient told that ablation was not completely effective. Current thyroid status unknown as of 07/27/11.    Luiz Blare disease 1996    s/p radioablation x 2    No past surgical history on file.  Current Outpatient Prescriptions on File Prior to Visit  Medication Sig Dispense Refill  . Artificial Tear Ointment (EYE LUBRICANT) OINT Apply 0.5 inches to eye at bedtime. 3.5 g 2  . fluticasone (FLONASE) 50 MCG/ACT nasal spray Place 2 sprays into both nostrils daily. 16 g 6  . guaiFENesin (MUCINEX) 600 MG 12 hr tablet Take 2 tablets (1,200 mg total) by mouth 2 (two) times daily. 20 tablet 0  . hydrochlorothiazide (HYDRODIURIL) 25 MG tablet TAKE 1 TABLET BY MOUTH ONCE DAILY 30 tablet 0  . propranolol (INDERAL) 80 MG tablet TAKE 1 TABLET BY MOUTH ONCE A DAY 30 tablet 0  . SM ANTIFUNGAL CLOTRIMAZOLE 1 % cream APPLY TOPICALLY 2 (TWO) TIMES DAILY. 30 g 1   No current facility-administered medications on file prior to visit.    No Known Allergies  Social History   Social History  . Marital Status: Single    Spouse Name: N/A  . Number of Children: N/A    . Years of Education: N/A   Occupational History  . unemployed    Social History Main Topics  . Smoking status: Current Every Day Smoker -- 0.30 packs/day for 15 years    Types: Cigarettes  . Smokeless tobacco: Never Used  . Alcohol Use: 10.8 oz/week    16 Glasses of wine, 2 Shots of liquor per week  . Drug Use: No  . Sexual Activity: Not Currently    Birth Control/ Protection: Abstinence, IUD   Other Topics Concern  . None   Social History Narrative   Lives alone. Works for [employer]. She has been working there for 13 months.     Family History  Problem Relation Age of Onset  . Asthma Mother   . Diabetes Mother   . Hypertension Mother   . Alcohol abuse Father   . Diabetes Father   . Heart disease Sister   . Heart disease Brother   . Hypertension Brother   . Diabetes Brother   . Hyperlipidemia Brother   . Cancer Maternal Aunt   . COPD Maternal Aunt   . COPD Maternal Uncle   . Cancer Maternal Grandmother     ROS  General: No fevers, night sweats, chills Skin: no rashes Eyes, Ears, Nose, Throat:  Heart: no  chest pain, palpitations Lungs: no dyspnea, coughing Digestive: no abdominal pain, nausea, vomiting Genitourinary: positive for leakage of urine Musculoskeletal: negative for joint/muscle pain Neurological: Positive for dizziness Psychological: positive for sadness and anxiety Endocrine: no polyuria or polydipsia  Objective   BP 122/69 mmHg  Pulse 69  Temp(Src) 98.2 F (36.8 C) (Oral)  Wt 305 lb 3.2 oz (138.438 kg)  General: Well appearing HEENT:   Head:  Normocephalic  Eyes: Pupils equal and reactive to light/accomodation. Extraocular movements intact bilaterally. Exophthalmos present  Ears: Tympanic membranes normal bilaterally.  Nose/Throat: Nares patent bilaterally. Oropharnx clear and moist.  Neck: No cervical adenopathy bilaterally. Could not palpate thyroid Respiratory/Chest: Clear to auscultation bilaterally. Unlabored  work of breathing. No wheezing or rales. Cardiovascular: Regular rate and rhythm. Normal S1 and S2. No heart murmurs present. No extra heart sounds Gastrointestinal: Soft, non-tender, non-distended, no guarding, no rebound, no masses felt Genitourinary: Vagina appears normal. Cervix visible with visible string eminating from os. No discharge.    Musculoskeletal: Normal tone, no calf tenderness Neuro: Alert, oriented Dermatologic: No obvious rashes Psychiatric: Full affect  Assessment and Plan    No orders of the defined types were placed in this encounter.    Health Maintenance Due  Topic Date Due  . HIV Screening  03/08/1985  . PAP SMEAR  03/09/1991  . INFLUENZA VACCINE  08/26/2014    Labs:  Orders Placed This Encounter  Procedures  . Flu Vaccine QUAD 36+ mos IM  . CBC with Differential/Platelet  . COMPLETE METABOLIC PANEL WITH GFR  . TSH  . LDL cholesterol, direct  . HIV antibody  . POCT glycosylated hemoglobin (Hb A1C)    1. HYPERTENSION, BENIGN SYSTEMIC - no change to regimen - CBC with Differential/Platelet - COMPLETE METABOLIC PANEL WITH GFR - TSH - LDL cholesterol, direct  2. Graves disease - TSH - propranolol (INDERAL) 80 MG tablet; TAKE 1 TABLET BY MOUTH ONCE A DAY  Dispense: 30 tablet; Refill: 0  3. Hyperglycemia - POCT glycosylated hemoglobin (Hb A1C)  4. Screening for malignant neoplasm of cervix - Cytology - PAP [place]  5. Screening for HIV (human immunodeficiency virus) - HIV antibody  6. Healthcare maintenance - patient to schedule for repeat mammogram

## 2015-02-06 LAB — HIV ANTIBODY (ROUTINE TESTING W REFLEX): HIV 1&2 Ab, 4th Generation: NONREACTIVE

## 2015-02-06 LAB — TSH: TSH: 1.188 u[IU]/mL (ref 0.350–4.500)

## 2015-02-07 ENCOUNTER — Telehealth: Payer: Self-pay

## 2015-02-07 LAB — CYTOLOGY - PAP

## 2015-02-18 ENCOUNTER — Encounter: Payer: Self-pay | Admitting: Family Medicine

## 2015-02-18 DIAGNOSIS — E785 Hyperlipidemia, unspecified: Secondary | ICD-10-CM | POA: Insufficient documentation

## 2015-02-24 ENCOUNTER — Other Ambulatory Visit: Payer: Self-pay | Admitting: Family Medicine

## 2015-02-24 DIAGNOSIS — E0500 Thyrotoxicosis with diffuse goiter without thyrotoxic crisis or storm: Secondary | ICD-10-CM

## 2015-02-24 NOTE — Telephone Encounter (Signed)
Needs refill on HCTZ, propanlol and flonase(pt was told there were refills available on [address] on [address]

## 2015-02-25 MED ORDER — PROPRANOLOL HCL 80 MG PO TABS: ORAL_TABLET | ORAL | Status: DC

## 2015-02-25 NOTE — Addendum Note (Signed)
Addended by: Henri Medal on: 02/25/2015 08:05 AM   Modules accepted: Orders

## 2015-03-11 NOTE — Telephone Encounter (Signed)
Error

## 2015-04-22 ENCOUNTER — Ambulatory Visit (INDEPENDENT_AMBULATORY_CARE_PROVIDER_SITE_OTHER): Payer: BLUE CROSS/BLUE SHIELD | Admitting: Family Medicine

## 2015-04-22 ENCOUNTER — Encounter: Payer: Self-pay | Admitting: Family Medicine

## 2015-04-22 VITALS — BP 120/73 | HR 67 | Temp 98.0°F | Wt 306.9 lb

## 2015-04-22 DIAGNOSIS — H8110 Benign paroxysmal vertigo, unspecified ear: Secondary | ICD-10-CM | POA: Diagnosis not present

## 2015-04-22 MED ORDER — MECLIZINE HCL 25 MG PO TABS: 25.0000 mg | ORAL_TABLET | Freq: Three times a day (TID) | ORAL | Status: DC | PRN

## 2015-04-22 MED ORDER — ONDANSETRON 4 MG PO TBDP: 4.0000 mg | ORAL_TABLET | Freq: Three times a day (TID) | ORAL | Status: DC | PRN

## 2015-04-22 NOTE — Progress Notes (Signed)
Date of Visit: 04/22/2015   HPI:  Patient presents for a same day appointment to discuss dizziness.  Had an episode yesterday lasting about 30 mins where she suddenly stood up and felt dizzy. Felt drunk, was staggering and having to grab onto things at work to stay upright. Felt nauseated but no vomiting. Had some ringing in her ears then. Felt like the room was spinning. Since then she's not felt right. Feels dizzy, mildly nauseated, and has pressure behind her eyes. 2 weeks ago she had a cold. No fevers, ear pain, trouble hearing, speech changes, numbness, tingling, weakness. Has never had anything like this before. Denies current runny nose, sore throat, or cough. Does take zyrtec and flonase at home.  Patient denies the possibility of pregnancy, states has not been sexually active in 4 years.  ROS: See HPI  PMFSH: history of graves disease, hypertension, obesity, tobacco abuse, sleep apnea  PHYSICAL EXAM: BP 120/73 mmHg  Pulse 67  Temp(Src) 98 F (36.7 C) (Oral)  Wt 306 lb 14.4 oz (139.209 kg) Gen: NAD, pleasant, cooperative HEENT: normocephalic, atraumatic, moist mucous membranes, oropharynx clear. Tympanic membranes not visualized due to cerumen impaction. No anterior cervical or supraclavicular lymphadenopathy. Nares patent Heart: regular rate and rhythm, no murmur Lungs: clear to auscultation bilaterally, normal work of breathing  Abdomen: soft, nontender to palpation  Neuro: cranial nerves II-XII tested and intact. Speech normal. Full strength bilat upper and lower ext. Normal FNF. no nystagmus.  ASSESSMENT/PLAN:  1. Vertigo - suspect BPPV. No red flags, and normal neuro exam today. Will treat symptomatically with meclizine and zofran. Continue flonase and zyrtec at home. Follow up if not improved within the next week.   FOLLOW UP: Follow up as needed if symptoms worsen or fail to improve.    GrenadaBrittany J. Pollie MeyerMcIntyre, MD [facility]

## 2015-04-22 NOTE — Patient Instructions (Signed)
Continue flonase and zyrtec Follow up with Dr. Caleb PoppNettey next week if not better  Sent in meclizine (vertigo medicine) and zofran (nausea medicine) Stay well hydrated  Be well, Dr. Pollie MeyerMcIntyre   Benign Positional Vertigo Vertigo is the feeling that you or your surroundings are moving when they are not. Benign positional vertigo is the most common form of vertigo. The cause of this condition is not serious (is benign). This condition is triggered by certain movements and positions (is positional). This condition can be dangerous if it occurs while you are doing something that could endanger you or others, such as driving.  CAUSES In many cases, the cause of this condition is not known. It may be caused by a disturbance in an area of the inner ear that helps your brain to sense movement and balance. This disturbance can be caused by a viral infection (labyrinthitis), head injury, or repetitive motion. RISK FACTORS This condition is more likely to develop in:  Women.  People who are 45 years of age or older. SYMPTOMS Symptoms of this condition usually happen when you move your head or your eyes in different directions. Symptoms may start suddenly, and they usually last for less than a minute. Symptoms may include:  Loss of balance and falling.  Feeling like you are spinning or moving.  Feeling like your surroundings are spinning or moving.  Nausea and vomiting.  Blurred vision.  Dizziness.  Involuntary eye movement (nystagmus). Symptoms can be mild and cause only slight annoyance, or they can be severe and interfere with daily life. Episodes of benign positional vertigo may return (recur) over time, and they may be triggered by certain movements. Symptoms may improve over time. DIAGNOSIS This condition is usually diagnosed by medical history and a physical exam of the head, neck, and ears. You may be referred to a health care provider who specializes in ear, nose, and throat (ENT)  problems (otolaryngologist) or a provider who specializes in disorders of the nervous system (neurologist). You may have additional testing, including:  MRI.  A CT scan.  Eye movement tests. Your health care provider may ask you to change positions quickly while he or she watches you for symptoms of benign positional vertigo, such as nystagmus. Eye movement may be tested with an electronystagmogram (ENG), caloric stimulation, the Dix-Hallpike test, or the roll test.  An electroencephalogram (EEG). This records electrical activity in your brain.  Hearing tests. TREATMENT Usually, your health care provider will treat this by moving your head in specific positions to adjust your inner ear back to normal. Surgery may be needed in severe cases, but this is rare. In some cases, benign positional vertigo may resolve on its own in 2-4 weeks. HOME CARE INSTRUCTIONS Safety  Move slowly.Avoid sudden body or head movements.  Avoid driving.  Avoid operating heavy machinery.  Avoid doing any tasks that would be dangerous to you or others if a vertigo episode would occur.  If you have trouble walking or keeping your balance, try using a cane for stability. If you feel dizzy or unstable, sit down right away.  Return to your normal activities as told by your health care provider. Ask your health care provider what activities are safe for you. General Instructions  Take over-the-counter and prescription medicines only as told by your health care provider.  Avoid certain positions or movements as told by your health care provider.  Drink enough fluid to keep your urine clear or pale yellow.  Keep all follow-up visits  as told by your health care provider. This is important. SEEK MEDICAL CARE IF:  You have a fever.  Your condition gets worse or you develop new symptoms.  Your family or friends notice any behavioral changes.  Your nausea or vomiting gets worse.  You have numbness or a "pins  and needles" sensation. SEEK IMMEDIATE MEDICAL CARE IF:  You have difficulty speaking or moving.  You are always dizzy.  You faint.  You develop severe headaches.  You have weakness in your legs or arms.  You have changes in your hearing or vision.  You develop a stiff neck.  You develop sensitivity to light.   This information is not intended to replace advice given to you by your health care provider. Make sure you discuss any questions you have with your health care provider.   Document Released: 10/19/2005 Document Revised: 10/02/2014 Document Reviewed: 05/06/2014 Elsevier Interactive Patient Education [employer]

## 2015-04-24 ENCOUNTER — Ambulatory Visit: Payer: BLUE CROSS/BLUE SHIELD | Admitting: Family Medicine

## 2015-06-28 ENCOUNTER — Other Ambulatory Visit: Payer: Self-pay | Admitting: Family Medicine

## 2015-07-01 NOTE — Telephone Encounter (Signed)
Needs refill on propranolol [facility] [address]

## 2015-08-09 ENCOUNTER — Ambulatory Visit (HOSPITAL_COMMUNITY)
Admission: EM | Admit: 2015-08-09 | Discharge: 2015-08-09 | Disposition: A | Discharge status: Home / Self Care | Payer: BLUE CROSS/BLUE SHIELD | Attending: Family Medicine | Admitting: Family Medicine

## 2015-08-09 ENCOUNTER — Ambulatory Visit (HOSPITAL_COMMUNITY): Payer: BLUE CROSS/BLUE SHIELD

## 2015-08-09 ENCOUNTER — Encounter (HOSPITAL_COMMUNITY): Payer: Self-pay | Admitting: Emergency Medicine

## 2015-08-09 DIAGNOSIS — I10 Essential (primary) hypertension: Secondary | ICD-10-CM | POA: Diagnosis not present

## 2015-08-09 DIAGNOSIS — S90852A Superficial foreign body, left foot, initial encounter: Secondary | ICD-10-CM | POA: Diagnosis not present

## 2015-08-09 DIAGNOSIS — W25XXXA Contact with sharp glass, initial encounter: Secondary | ICD-10-CM | POA: Diagnosis not present

## 2015-08-09 DIAGNOSIS — E0500 Thyrotoxicosis with diffuse goiter without thyrotoxic crisis or storm: Secondary | ICD-10-CM | POA: Diagnosis not present

## 2015-08-09 DIAGNOSIS — F17210 Nicotine dependence, cigarettes, uncomplicated: Secondary | ICD-10-CM | POA: Insufficient documentation

## 2015-08-09 DIAGNOSIS — M79672 Pain in left foot: Secondary | ICD-10-CM | POA: Diagnosis not present

## 2015-08-09 DIAGNOSIS — M7989 Other specified soft tissue disorders: Secondary | ICD-10-CM | POA: Diagnosis not present

## 2015-08-09 HISTORY — DX: Essential (primary) hypertension: I10

## 2015-08-09 MED ORDER — TETANUS-DIPHTH-ACELL PERTUSSIS 5-2.5-18.5 LF-MCG/0.5 IM SUSP: 0.5000 mL | Freq: Once | INTRAMUSCULAR | Status: DC

## 2015-08-09 MED ORDER — IBUPROFEN 400 MG PO TABS: 400.0000 mg | ORAL_TABLET | Freq: Three times a day (TID) | ORAL | Status: DC | PRN

## 2015-08-09 MED ORDER — LIDOCAINE-EPINEPHRINE (PF) 2 %-1:200000 IJ SOLN
INTRAMUSCULAR | Status: AC
  Filled 2015-08-09: qty 20

## 2015-08-09 NOTE — ED Notes (Signed)
Sterile guaze and paper tape applied to left heel.

## 2015-08-09 NOTE — ED Notes (Signed)
Patient stepped on glass 2 weeks.  Patient removed some glass and foot felt better.  Last week patient stepped and felt pain, realizing something still in foot.  Left foot is the issue, reports swelling.

## 2015-08-09 NOTE — ED Notes (Signed)
Patient going to radiology at [facility].  Called and notified radiology that patient coming

## 2015-08-09 NOTE — ED Notes (Signed)
Patient returned from xray.

## 2015-08-09 NOTE — Discharge Instructions (Signed)
The glass in your foot was successfully removed. Please keep area clean and dry. See us soon if having any concern.

## 2015-08-09 NOTE — ED Provider Notes (Signed)
CSN: [id number]     Arrival date & time 08/09/15  1541 History   First MD Initiated Contact with Patient 08/09/15 1751     Chief Complaint  Patient presents with  . Foreign Body   (Consider location/radiation/quality/duration/timing/severity/associated sxs/prior Treatment) HPI  FB: Patient stepped on glass 2 weeks ago and felt  Foreign body inside her left heel. She removed the glass and thought it all came out. Just few days ago she started feeling as if there is still some object in her heel causing irritation pain and swelling. Denies fever. No other concern.  Past Medical History  Diagnosis Date  . GRAVES DISEASE 03/24/2006    Diagnosed 1996. S/p radio ablation x 2. Patient told that ablation was not completely effective. Current thyroid status unknown as of 07/27/11.    Luiz Blare. Graves disease 1996    s/p radioablation x 2  . Hypertension    History reviewed. No pertinent past surgical history. Family History  Problem Relation Age of Onset  . Asthma Mother   . Diabetes Mother   . Hypertension Mother   . Alcohol abuse Father   . Diabetes Father   . Heart disease Sister   . Heart disease Brother   . Hypertension Brother   . Diabetes Brother   . Hyperlipidemia Brother   . Cancer Maternal Aunt   . COPD Maternal Aunt   . COPD Maternal Uncle   . Cancer Maternal Grandmother    Social History  Substance Use Topics  . Smoking status: Current Every Day Smoker -- 0.30 packs/day for 15 years    Types: Cigarettes  . Smokeless tobacco: Never Used  . Alcohol Use: 10.8 oz/week    16 Glasses of wine, 2 Shots of liquor per week   OB History    No data available     Review of Systems  Respiratory: Negative.   Cardiovascular: Negative.   Gastrointestinal: Negative.   Musculoskeletal:       FB sensation in her left heel  All other systems reviewed and are negative.   Allergies  Review of patient's allergies indicates no known allergies.  Home Medications   Prior to Admission  medications   Medication Sig Start Date End Date Taking? Authorizing Provider  propranolol (INDERAL) 80 MG tablet take 1 tablet by mouth once daily 07/01/15  Yes Narda Bondsalph A Nettey, MD  Artificial Tear Ointment (EYE LUBRICANT) OINT Apply 0.5 inches to eye at bedtime. 01/09/13   Lonia SkinnerStephanie E Losq, MD  fluticasone (FLONASE) 50 MCG/ACT nasal spray Place 2 sprays into both nostrils daily. 02/05/15   Narda Bondsalph A Nettey, MD  guaiFENesin (MUCINEX) 600 MG 12 hr tablet Take 2 tablets (1,200 mg total) by mouth 2 (two) times daily. 10/26/12   Charm RingsErin J Honig, MD  hydrochlorothiazide (HYDRODIURIL) 25 MG tablet take 1 tablet by mouth once daily 02/24/15   Narda Bondsalph A Nettey, MD  meclizine (ANTIVERT) 25 MG tablet Take 1 tablet (25 mg total) by mouth 3 (three) times daily as needed for dizziness. 04/22/15   Latrelle DodrillBrittany J McIntyre, MD  ondansetron (ZOFRAN ODT) 4 MG disintegrating tablet Take 1 tablet (4 mg total) by mouth every 8 (eight) hours as needed for nausea. 04/22/15   Latrelle DodrillBrittany J McIntyre, MD  SM ANTIFUNGAL CLOTRIMAZOLE 1 % cream APPLY TOPICALLY 2 (TWO) TIMES DAILY. 02/19/13   Garnetta BuddyEdward V Williamson, MD   Meds Ordered and Administered this Visit  Medications - No data to display  BP 134/82 mmHg  Pulse 74  Temp(Src) 97.7 F (  36.5 C) (Oral)  Resp 22  SpO2 97% No data found.   Physical Exam  Constitutional: She appears well-developed. No distress.  Cardiovascular: Normal rate, regular rhythm and normal heart sounds.   No murmur heard. Pulmonary/Chest: Effort normal and breath sounds normal. No respiratory distress. She has no wheezes.  Musculoskeletal: Normal range of motion.       Feet:  Nursing note and vitals reviewed.   ED Course  .Foreign Body Removal Date/Time: 08/09/2015 7:00 PM Performed by: Doreene Eland Authorized by: Janit Pagan T Consent: Verbal consent obtained. Risks and benefits: risks, benefits and alternatives were discussed Consent given by: patient Patient understanding: patient states  understanding of the procedure being performed Patient consent: the patient's understanding of the procedure matches consent given Procedure consent: procedure consent matches procedure scheduled Relevant documents: relevant documents present and verified Site marked: the operative site was marked Imaging studies: imaging studies available Patient identity confirmed: verbally with patient Time out: Immediately prior to procedure a "time out" was called to verify the correct patient, procedure, equipment, support staff and site/side marked as required. Intake: left heel/foot. Anesthesia: local infiltration Local anesthetic: lidocaine 1% with epinephrine Anesthetic total: 5 ml Patient sedated: no Patient restrained: no Complexity: simple 1 objects recovered. Objects recovered: 2-3 mm glass Post-procedure assessment: foreign body removed Patient tolerance: Patient tolerated the procedure well with no immediate complications   (including critical care time)      Labs Review Labs Reviewed - No data to display  Imaging Review No results found.   Visual Acuity Review  Right Eye Distance:   Left Eye Distance:   Bilateral Distance:    Right Eye Near:   Left Eye Near:    Bilateral Near:       Dg Foot Complete Left  08/09/2015  CLINICAL DATA:  45 year old female with left foot pain and swelling for 2 weeks after stepping on broken glass near the heel and midfoot. Increasing pain and swelling, possible retained fragments. Initial encounter. EXAM: LEFT FOOT - COMPLETE 3+ VIEW COMPARISON:  None. FINDINGS: Generalized soft tissue swelling about the left foot. Just anterior to the heel there is a small 3 mm triangular density (lateral view arrows) which is suspicious for retained glass fragment in this setting. No other radiopaque foreign body identified. No subcutaneous gas. Bone mineralization is within normal limits. Joint spaces and alignment preserved. No acute osseous abnormality  identified. IMPRESSION: 1. Small 3 mm triangular density along the plantar surface just anterior to the heel (see lateral view) suspicious for retained glass fragment. 2. No other radiopaque foreign body identified, but not all glass is radiopaque. 3. Generalized soft tissue swelling. No acute osseous abnormality identified. Electronically Signed   By: Odessa Fleming M.D.   On: 08/09/2015 18:29     MDM  No diagnosis found. Foreign body in foot, left, initial encounter  Xray done to confirm presence of FB. Report reviewed. Glass piece removed and shown to patient. Wound area cleaned, no dressing needed, no suturing needed. Instruction given to keep area clean and dry. No sign of infection. Immunization record reviewed. She got Tdap in 2014. Return precaution discussed. F/U as needed.   Doreene Eland, MD 08/09/15 [phone]

## 2015-08-25 ENCOUNTER — Telehealth: Payer: Self-pay | Admitting: Family Medicine

## 2015-08-25 DIAGNOSIS — G4733 Obstructive sleep apnea (adult) (pediatric): Secondary | ICD-10-CM

## 2015-08-25 NOTE — Telephone Encounter (Signed)
Pt would like a Rx for supplies for her cpap machine. Please call her and let her know if this is something we can do for her. Thanks! ep

## 2015-08-28 ENCOUNTER — Other Ambulatory Visit: Payer: Self-pay | Admitting: *Deleted

## 2015-08-28 MED ORDER — HYDROCHLOROTHIAZIDE 25 MG PO TABS: 25.0000 mg | ORAL_TABLET | Freq: Every day | ORAL | 5 refills | Status: DC

## 2015-09-19 NOTE — Telephone Encounter (Signed)
Pt called to check the status of her CPAP supplies she called at the end of July. jw

## 2015-09-23 ENCOUNTER — Other Ambulatory Visit: Payer: Self-pay | Admitting: *Deleted

## 2015-09-23 ENCOUNTER — Other Ambulatory Visit: Payer: Self-pay | Admitting: Family Medicine

## 2015-09-23 MED ORDER — PROPRANOLOL HCL 80 MG PO TABS
80.0000 mg | ORAL_TABLET | Freq: Every day | ORAL | 2 refills | Status: DC
Start: 2015-09-23 — End: 2015-11-26

## 2015-09-23 NOTE — Telephone Encounter (Signed)
Patient needs to specify what she needs.  I'm not sure what "supplies" means and will she be picking this up at her pharmacy or another place?    Thanks, Jesusita Okaan

## 2015-10-01 NOTE — Telephone Encounter (Signed)
Contacted pt and she said that she would be getting the supplies from [facility].  She stated that she needs like the tubing and she wasn't sure what else, she stated that you are supposed to replace things periodically and that she needs her updated.  Told her I would send to PCP to see if there is like a package or something that can be ordered for her. Lamonte SakaiZimmerman Rumple, April D, [place]

## 2015-10-01 NOTE — Telephone Encounter (Signed)
Told pt we would contact her back once the PCP responds. Lamonte SakaiZimmerman Rumple, Opaline Reyburn D, [place]

## 2015-10-02 NOTE — Addendum Note (Signed)
Addended by: Renne MuscaWARDEN, Briahnna Harries L on: 10/02/2015 11:14 AM   Modules accepted: Orders

## 2015-10-06 ENCOUNTER — Ambulatory Visit: Payer: BLUE CROSS/BLUE SHIELD | Admitting: Family Medicine

## 2015-10-09 NOTE — Addendum Note (Signed)
Addended by: Renne MuscaWARDEN, Jazleen Robeck L on: 10/09/2015 02:20 PM   Modules accepted: Orders

## 2015-10-09 NOTE — Telephone Encounter (Signed)
Called patient and she did not answer.  Was going to ask her to call [facility] and ask them what supplies she would need for her brand of CPAP machine.  I did not prescribe the machine and so I would not have this information.  If she provides me with this information, I'm happy to do this for her.   Thanks, Jesusita Okaan

## 2015-11-26 ENCOUNTER — Encounter: Payer: Self-pay | Admitting: Family Medicine

## 2015-11-26 ENCOUNTER — Ambulatory Visit (INDEPENDENT_AMBULATORY_CARE_PROVIDER_SITE_OTHER): Payer: BLUE CROSS/BLUE SHIELD | Admitting: Family Medicine

## 2015-11-26 VITALS — BP 122/78 | HR 87 | Temp 98.1°F | Ht 64.0 in | Wt 319.4 lb

## 2015-11-26 DIAGNOSIS — H578 Other specified disorders of eye and adnexa: Secondary | ICD-10-CM | POA: Diagnosis not present

## 2015-11-26 DIAGNOSIS — Z23 Encounter for immunization: Secondary | ICD-10-CM | POA: Diagnosis not present

## 2015-11-26 DIAGNOSIS — J069 Acute upper respiratory infection, unspecified: Secondary | ICD-10-CM | POA: Diagnosis not present

## 2015-11-26 DIAGNOSIS — G4730 Sleep apnea, unspecified: Secondary | ICD-10-CM

## 2015-11-26 DIAGNOSIS — B9789 Other viral agents as the cause of diseases classified elsewhere: Secondary | ICD-10-CM

## 2015-11-26 DIAGNOSIS — H5789 Other specified disorders of eye and adnexa: Secondary | ICD-10-CM

## 2015-11-26 MED ORDER — ONDANSETRON 4 MG PO TBDP: 4.0000 mg | ORAL_TABLET | Freq: Three times a day (TID) | ORAL | 0 refills | Status: DC | PRN

## 2015-11-26 MED ORDER — HYDROCHLOROTHIAZIDE 25 MG PO TABS: 25.0000 mg | ORAL_TABLET | Freq: Every day | ORAL | 5 refills | Status: DC

## 2015-11-26 MED ORDER — FLUTICASONE PROPIONATE 50 MCG/ACT NA SUSP: 2.0000 | Freq: Every day | NASAL | 6 refills | Status: DC

## 2015-11-26 MED ORDER — IBUPROFEN 400 MG PO TABS: 400.0000 mg | ORAL_TABLET | Freq: Three times a day (TID) | ORAL | 0 refills | Status: DC | PRN

## 2015-11-26 MED ORDER — MECLIZINE HCL 25 MG PO TABS: 25.0000 mg | ORAL_TABLET | Freq: Three times a day (TID) | ORAL | 0 refills | Status: DC | PRN

## 2015-11-26 MED ORDER — PROPRANOLOL HCL 80 MG PO TABS: 80.0000 mg | ORAL_TABLET | Freq: Every day | ORAL | 2 refills | Status: DC

## 2015-11-26 NOTE — Patient Instructions (Signed)
Molly White, you were seen today to discuss receiving a new CPAP machine. I put an for a new machine.  I also put in referral for a another sleep study to determine if we need to repeat your CPAP machine.  I would like to see you in a month to follow-up with general medical care .  Nice meeting you, Molly BruntDaniel L. Myrtie SomanWarden, MD [facility] Resident PGY-1 11/26/2015 3:23 PM

## 2015-11-26 NOTE — Assessment & Plan Note (Signed)
Patient with known sleep apnea, last study was done in 2012. Provided me with paperwork from [facility], with instructions on how to order a new CPAP machine for her. Since her last study was done 2012, and appropriate to do a repeat titration study at this point.   - wrote for DME for CPAP machine - Referral for outpatient sleep study to do re-titration

## 2015-11-26 NOTE — Progress Notes (Signed)
    Subjective:  Molly White is a 45 y.o. female [facility] a chief complaint of sleep apnea, and is requesting additional refills for her CPAP machine  HPI:  Sleep apnea: Patient has diagnosed sleep apnea, and is here to receive a prescription for some new materials for her CPAP.   PMH: Hypertension, sleep apnea, obesity ROS: See history of present illness  Objective:  Physical Exam: BP 122/78 (BP Location: Left Arm, Patient Position: Sitting, Cuff Size: Normal)   Pulse 87   Temp 98.1 F (36.7 C) (Oral)   Ht 5\' 4"  (1.626 m)   Wt (!) 319 lb 6.4 oz (144.9 kg)   BMI 54.82 kg/m   Gen: 45 year old obese female in NAD, resting comfortably CV: RRR with no murmurs appreciated Pulm: NWOB, CTAB with no crackles, wheezes, or rhonchi GI: Normal bowel sounds present. Soft, Nontender, Nondistended. MSK: no edema, cyanosis, or clubbing noted Skin: warm, dry Neuro: grossly normal, moves all extremities Psych: Normal affect and thought content  No results found for this or any previous visit (from the past 72 hour(s)).   Assessment/Plan:  Sleep apnea Patient with known sleep apnea, last study was done in 2012. Provided me with [facility], with instructions on how to order a new CPAP machine for her. Since her last study was done 2012, and appropriate to do a repeat titration study at this point.   - wrote for DME for CPAP machine - Referral for outpatient sleep study to do re-titration

## 2016-02-03 ENCOUNTER — Ambulatory Visit (INDEPENDENT_AMBULATORY_CARE_PROVIDER_SITE_OTHER): Payer: BLUE CROSS/BLUE SHIELD | Admitting: Pulmonary Disease

## 2016-02-03 ENCOUNTER — Encounter: Payer: Self-pay | Admitting: Pulmonary Disease

## 2016-02-03 VITALS — BP 110/74 | HR 83 | Ht 64.0 in | Wt 327.6 lb

## 2016-02-03 DIAGNOSIS — G4733 Obstructive sleep apnea (adult) (pediatric): Secondary | ICD-10-CM | POA: Diagnosis not present

## 2016-02-03 DIAGNOSIS — Z720 Tobacco use: Secondary | ICD-10-CM

## 2016-02-03 NOTE — Progress Notes (Signed)
Subjective:    Patient ID: Molly White, female    DOB: 1970-12-16, 46 y.o.   MRN: [id number]  HPI   This is the case of Molly White, [age] y.o. Female, who was referred by Dr. Elwanda Brooklynaniel Warden  in consultation regarding OSA.   As you very well know, patient has a 10 PY smoking history, not known to have asthma or copd, was diagnosed with severe OSA in 2012 with AHI of 120. She was very symptomatic with snoring, witnessed apneas, gasping, choking, hypersomnia.  She was placed on CPAP 15 cm water.  She felt better with CPAP.  He machine broke in Oct 2017.  It just stopped working.  [facility] and they eventually were not able to fix it and was told she needed a new machine. Her PCP ordered a new cpap but was told she needed a new sleep study.  In the meantime, she is using [facility] Industrial[facility]  She feels better with her cpap loaner.  She is not on o2.   There is no break in cpap treatment.  When her machine broke, [facility]  Has a nasal mask and chin strap.        Review of Systems  Constitutional: Negative.  Negative for fever and unexpected weight change.  HENT: Negative.  Negative for congestion, dental problem, ear pain, nosebleeds, postnasal drip, rhinorrhea, sinus pressure, sneezing, sore throat and trouble swallowing.   Eyes: Negative.  Negative for redness and itching.  Respiratory: Positive for wheezing. Negative for cough, chest tightness and shortness of breath.   Cardiovascular: Positive for palpitations. Negative for leg swelling.  Gastrointestinal: Positive for nausea. Negative for vomiting.  Genitourinary: Negative.  Negative for dysuria.  Musculoskeletal: Negative.  Negative for joint swelling.  Skin: Negative.  Negative for rash.  Neurological: Negative.  Negative for headaches.  Hematological: Negative.  Does not bruise/bleed easily.  Psychiatric/Behavioral: Negative.  Negative for dysphoric mood. The patient is not  nervous/anxious.    Past Medical History:  Diagnosis Date  . GRAVES DISEASE 03/24/2006   Diagnosed 1996. S/p radio ablation x 2. Patient told that ablation was not completely effective. Current thyroid status unknown as of 07/27/11.    Luiz Blare. Graves disease 1996   s/p radioablation x 2  . Hypertension    (-) CA, DVT  Family History  Problem Relation Age of Onset  . Asthma Mother   . Diabetes Mother   . Hypertension Mother   . Alcohol abuse Father   . Diabetes Father   . Heart disease Sister   . Heart disease Brother   . Hypertension Brother   . Diabetes Brother   . Hyperlipidemia Brother   . Cancer Maternal Aunt   . COPD Maternal Aunt   . COPD Maternal Uncle   . Cancer Maternal Grandmother      No past surgical history on file.No surgery.   Social History   Social History  . Marital status: Single    Spouse name: N/A  . Number of children: N/A  . Years of education: N/A   Occupational History  . unemployed    Social History Main Topics  . Smoking status: Current Every Day Smoker    Packs/day: 0.30    Years: 15.00    Types: Cigarettes  . Smokeless tobacco: Never Used  . Alcohol use 10.8 oz/week    16 Glasses of wine, 2 Shots of liquor per week  .  Drug use: No  . Sexual activity: Not Currently    Birth control/ protection: Abstinence, IUD   Other Topics Concern  . Not on file   Social History Narrative   Lives alone. Works for [employer]. She has been working there for 13 months.      No Known Allergies   Outpatient Medications Prior to Visit  Medication Sig Dispense Refill  . fluticasone (FLONASE) 50 MCG/ACT nasal spray Place 2 sprays into both nostrils daily. 16 g 6  . hydrochlorothiazide (HYDRODIURIL) 25 MG tablet Take 1 tablet (25 mg total) by mouth daily. 30 tablet 5  . meclizine (ANTIVERT) 25 MG tablet Take 1 tablet (25 mg total) by mouth 3 (three) times daily as needed for dizziness. 30 tablet 0  . ondansetron (ZOFRAN ODT) 4 MG  disintegrating tablet Take 1 tablet (4 mg total) by mouth every 8 (eight) hours as needed for nausea. 20 tablet 0  . propranolol (INDERAL) 80 MG tablet Take 1 tablet (80 mg total) by mouth daily. 30 tablet 2  . Artificial Tear Ointment (EYE LUBRICANT) OINT Apply 0.5 inches to eye at bedtime. (Patient not taking: Reported on 02/03/2016) 3.5 g 2  . guaiFENesin (MUCINEX) 600 MG 12 hr tablet Take 2 tablets (1,200 mg total) by mouth 2 (two) times daily. (Patient not taking: Reported on 02/03/2016) 20 tablet 0  . ibuprofen (ADVIL,MOTRIN) 400 MG tablet Take 1 tablet (400 mg total) by mouth every 8 (eight) hours as needed. (Patient not taking: Reported on 02/03/2016) 30 tablet 0  . SM ANTIFUNGAL CLOTRIMAZOLE 1 % cream APPLY TOPICALLY 2 (TWO) TIMES DAILY. (Patient not taking: Reported on 02/03/2016) 30 g 1   No facility-administered medications prior to visit.    No orders of the defined types were placed in this encounter.        Objective:   Physical Exam  Vitals:  Vitals:   02/03/16 1345  BP: 110/74  Pulse: 83  SpO2: 96%  Weight: (!) 327 lb 9.6 oz (148.6 kg)  Height: 5\' 4"  (1.626 m)    Constitutional/General:  Pleasant, well-nourished, well-developed, not in any distress,  Comfortably seating.  Well kempt  Body mass index is 56.23 kg/m. Wt Readings from Last 3 Encounters:  02/03/16 (!) 327 lb 9.6 oz (148.6 kg)  11/26/15 (!) 319 lb 6.4 oz (144.9 kg)  04/22/15 (!) 306 lb 14.4 oz (139.2 kg)     HEENT: Pupils equal and reactive to light and accommodation. Anicteric sclerae. Normal nasal mucosa.   No oral  lesions,  mouth clear,  oropharynx clear, no postnasal drip. (-) Oral thrush. No dental caries.  Airway - Mallampati class IV  Neck: No masses. Midline trachea. No JVD, (-) LAD. (-) bruits appreciated.  Respiratory/Chest: Grossly normal chest. (-) deformity. (-) Accessory muscle use.  Symmetric expansion. (-) Tenderness on palpation.  Resonant on percussion.  Diminished BS on both  lower lung zones. (-) wheezing, crackles, rhonchi (-) egophony  Cardiovascular: Regular rate and  rhythm, heart sounds normal, no murmur or gallops, no peripheral edema  Gastrointestinal:  Normal bowel sounds. Soft, non-tender. No hepatosplenomegaly.  (-) masses.   Musculoskeletal:  Normal muscle tone. Normal gait.   Extremities: Grossly normal. (-) clubbing, cyanosis.  (-) edema  Skin: (-) rash,lesions seen.   Neurological/Psychiatric : alert, oriented to time, place, person. Normal mood and affect           Assessment & Plan:  Obstructive sleep apnea Pt was diagnosed with severe OSA in  2012 with AHI of 120. She was very symptomatic with snoring, witnessed apneas, gasping, choking, hypersomnia.  She was placed on CPAP 15 cm water.  She felt better with CPAP.  He machine broke in Oct 2017.  It just stopped working.  She went to DME [facility] to have it fixed and they eventually were not able to fix it and was told she needed a new machine. Her PCP ordered a new cpap but was told she needed a new sleep study.  In the meantime, she is using a Industrial/product designer from [facility].  She feels better with her cpap loaner.  She is not on o2.   There is no break in cpap treatment.  When her machine broke, she got a loaner from [facility].  Has a nasal mask and chin strap.   DL in 01/6107 >> on cpap 15 cm water, 93%, AHI 0.5  Plan :  We extensively discussed the diagnosis, pathophysiology, and treatment options for Obstructive Sleep Apnea (OSA).  We discussed treatment options for OSA including CPAP, BiPaP, as well as surgical options and oral devices.   We will start patient on autocpap machine 5-15 cm water. We spoke with [facility] today and they will see if pt can get a new cpap w/o having to rpt sleep study.  If ever she needs a study, will do HST.  Good correction on cpap 15.  Will order autocpap for now.   Patient was instructed to call the office if he/she has not received the PAP device in 1-2 weeks.    Patient was instructed to have mask, tubings, filter, reservoir cleaned at least once a week with soapy water.  Patient was instructed to call the office if he/she is having issues with the PAP device.    I advised patient to obtain sufficient amount of sleep --  7 to 8 hours at least in a 24 hr period.  Patient was advised to follow good sleep hygiene.  Patient was advised NOT to engage in activities requiring concentration and/or vigilance if he/she is and  sleepy.  Patient is NOT to drive if he/she is sleepy.       Morbid obesity (HCC) Weight reduction  Tobacco user Smoking cessation done     Thank you very much for letting me participate in this patient's care. Please do not hesitate to give me a call if you have any questions or concerns regarding the treatment plan.   Patient will follow up with me in 6-8 weeks.     Pollie Meyer, MD 02/03/2016   2:18 PM Pulmonary and Critical Care Medicine [facility] Pager: [phone] Office: [phone], Fax: [phone]

## 2016-02-03 NOTE — Assessment & Plan Note (Signed)
Weight reduction 

## 2016-02-03 NOTE — Assessment & Plan Note (Signed)
Pt was diagnosed with severe OSA in 2012 with AHI of 120. She was very symptomatic with snoring, witnessed apneas, gasping, choking, hypersomnia.  She was placed on CPAP 15 cm water.  She felt better with CPAP.  He machine broke in Oct 2017.  It just stopped working.  [facility] and they eventually were not able to fix it and was told she needed a new machine. Her PCP ordered a new cpap but was told she needed a new sleep study.  In the meantime, she is using [facility]  She feels better with her cpap loaner.  She is not on o2.   There is no break in cpap treatment.  When her machine broke, [facility].  Has a nasal mask and chin strap.   DL in 1/61099/2017 >> on cpap 15 cm water, 93%, AHI 0.5  Plan :  We extensively discussed the diagnosis, pathophysiology, and treatment options for Obstructive Sleep Apnea (OSA).  We discussed treatment options for OSA including CPAP, BiPaP, as well as surgical options and oral devices.   We will start patient on autocpap machine 5-15 cm water. [facility] and [facility] will see if pt can get a new cpap w/o having to rpt sleep study.  If ever she needs a study, will do HST.  Good correction on cpap 15.  Will order autocpap for now.   Patient was instructed to call the office if he/she has not received the PAP device in 1-2 weeks.  Patient was instructed to have mask, tubings, filter, reservoir cleaned at least once a week with soapy water.  Patient was instructed to call the office if he/she is having issues with the PAP device.    I advised patient to obtain sufficient amount of sleep --  7 to 8 hours at least in a 24 hr period.  Patient was advised to follow good sleep hygiene.  Patient was advised NOT to engage in activities requiring concentration and/or vigilance if he/she is and  sleepy.  Patient is NOT to drive if he/she is sleepy.

## 2016-02-03 NOTE — Assessment & Plan Note (Signed)
Smoking cessation done.  

## 2016-02-03 NOTE — Patient Instructions (Signed)
  It was a pleasure taking care of you today!  You are diagnosed with Obstructive Sleep Apnea or OSA.  You stop breathing  120   times an hour.   We will order you an autoCPAP  machine.  Please call the office if you do NOT receive your machine in the next 1-2 weeks.   Please make sure you use your CPAP device everytime you sleep.  We will monitor the usage of your machine per your insurance requirement.  Your insurance company may take the machine from you if you are not using it regularly.   Please clean the mask, tubings, filter, water reservoir with soapy water every week.  Please use distilled water for the water reservoir.   Please call the office or your machine provider (DME company) if you are having issues with the device.   Return to clinic in 6-8 weeks with Dr. Christene Slatese Dios or NP.

## 2016-02-19 ENCOUNTER — Telehealth: Payer: Self-pay | Admitting: Pulmonary Disease

## 2016-02-19 DIAGNOSIS — G4730 Sleep apnea, unspecified: Secondary | ICD-10-CM

## 2016-02-19 NOTE — Telephone Encounter (Signed)
Spoke with pt. She states that her insurance is not in network with [facility]. Her CP[facility]. Order has been placed. Nothing further was needed.

## 2016-03-08 ENCOUNTER — Telehealth: Payer: Self-pay | Admitting: Pulmonary Disease

## 2016-03-08 NOTE — Telephone Encounter (Signed)
[facility], and was placed on a long hold. Will hold in triage to call back on 03-09-16.  [phone]  I have made pt aware. Pt voiced her understanding and had no further questions.

## 2016-03-10 NOTE — Telephone Encounter (Signed)
[facility] and they stated that they do not have cpap machines nor do they supply them, they just give the supplies for the machine. Contacted the pt. And informed her of this, she stated she needs the whole machine, and she will contact her insurance company with the information that I provided to her and find out who is in her network. Will await her call back

## 2016-03-17 NOTE — Telephone Encounter (Signed)
Patient left message with our answering service - [facility] pr

## 2016-03-17 NOTE — Telephone Encounter (Signed)
refaxed order to [facility] [address] [facility] ° °

## 2016-03-17 NOTE — Telephone Encounter (Signed)
PCCs Please see previous message pt gave office number for [facility] that was provided by insurance [phone]

## 2016-03-22 ENCOUNTER — Other Ambulatory Visit: Payer: Self-pay | Admitting: Family Medicine

## 2016-03-22 MED ORDER — FLUTICASONE PROPIONATE 50 MCG/ACT NA SUSP: 2.0000 | Freq: Every day | NASAL | 6 refills | Status: DC

## 2016-03-22 MED ORDER — HYDROCHLOROTHIAZIDE 25 MG PO TABS: 25.0000 mg | ORAL_TABLET | Freq: Every day | ORAL | 5 refills | Status: DC

## 2016-03-22 MED ORDER — PROPRANOLOL HCL 80 MG PO TABS: 80.0000 mg | ORAL_TABLET | Freq: Every day | ORAL | 2 refills | Status: DC

## 2016-03-22 NOTE — Telephone Encounter (Signed)
Pt needs refills on flonase, hydrochlorothiazide, and propranolol. [facility] [address]. ep

## 2016-03-30 ENCOUNTER — Telehealth: Payer: Self-pay | Admitting: Pulmonary Disease

## 2016-03-30 NOTE — Telephone Encounter (Signed)
Spoke with the pt  She states she has not heard anything from DME regarding CPAP mask fitting  I called [facility] and was advised they received the order, but since they did not supply the pt with CPAP machine, they will not be able to supply her with any supplies  PCC's- can you send order to another DME? [facility] and then [facility] so I am confused. Please help with this, thanks

## 2016-04-01 NOTE — Telephone Encounter (Signed)
[facility] can not do cpap supplies either I have spoke to lynn at [facility] nad there is no problem with thm taking her bcbs so the order was given back to them and the pt is aware she will be hearing from them [facility]

## 2016-04-01 NOTE — Telephone Encounter (Signed)
I have spoken to pt and I called [facility] they do not do cpap supplies I will call insurance [employer] to see what dme this pt can use Molly White

## 2016-04-05 ENCOUNTER — Ambulatory Visit: Payer: BLUE CROSS/BLUE SHIELD | Admitting: Pulmonary Disease

## 2016-04-06 ENCOUNTER — Ambulatory Visit: Payer: BLUE CROSS/BLUE SHIELD | Admitting: Pulmonary Disease

## 2016-04-08 ENCOUNTER — Ambulatory Visit: Payer: BLUE CROSS/BLUE SHIELD | Admitting: Family Medicine

## 2016-06-21 ENCOUNTER — Other Ambulatory Visit: Payer: Self-pay | Admitting: Family Medicine

## 2016-06-25 ENCOUNTER — Ambulatory Visit (INDEPENDENT_AMBULATORY_CARE_PROVIDER_SITE_OTHER): Payer: BLUE CROSS/BLUE SHIELD | Admitting: Family Medicine

## 2016-06-25 ENCOUNTER — Encounter: Payer: Self-pay | Admitting: Family Medicine

## 2016-06-25 DIAGNOSIS — I10 Essential (primary) hypertension: Secondary | ICD-10-CM

## 2016-06-25 MED ORDER — MECLIZINE HCL 25 MG PO TABS: 25.0000 mg | ORAL_TABLET | Freq: Three times a day (TID) | ORAL | 0 refills | Status: DC | PRN

## 2016-06-25 MED ORDER — FLUTICASONE PROPIONATE 50 MCG/ACT NA SUSP: 2.0000 | Freq: Every day | NASAL | 6 refills | Status: DC

## 2016-06-25 MED ORDER — PROPRANOLOL HCL 80 MG PO TABS: 80.0000 mg | ORAL_TABLET | Freq: Every day | ORAL | 2 refills | Status: DC

## 2016-06-25 MED ORDER — HYDROCHLOROTHIAZIDE 25 MG PO TABS: 25.0000 mg | ORAL_TABLET | Freq: Every day | ORAL | 5 refills | Status: DC

## 2016-06-25 MED ORDER — ONDANSETRON 4 MG PO TBDP: 4.0000 mg | ORAL_TABLET | Freq: Three times a day (TID) | ORAL | 0 refills | Status: DC | PRN

## 2016-06-25 NOTE — Assessment & Plan Note (Addendum)
Currently controlled on hydrochlorothiazide 25 mg daily. Blood pressure today was 124/68 and patient denies any chest pain, shortness of breath, changes in vision or headaches. - Continue hydrochlorothiazide 25 mg daily

## 2016-06-25 NOTE — Progress Notes (Signed)
    Subjective:  Molly BrownerLatoya White is a 46 y.o. female who [facility] for blood pressure follow up and for medication refill.   HPI:  [name] is a 46 year old female presenting today for medication refills. She has no concerns today, she just wanted to check up. No chest pain, SOB, headache, changes in vision, nausea, vomiting or diarrhea. No changes in urination or bowel habits. Does continue to smoke one half pack per day.   PMH: Hypertension, Graves' disease Tobacco use: smokes 1/2ppd Medication: reviewed and updated ROS: see HPI   Objective:  Physical Exam: BP 124/68   Pulse 63   Temp 98.3 F (36.8 C) (Oral)   Ht 5\' 4"  (1.626 m)   Wt (!) 310 lb (140.6 kg)   SpO2 97%   BMI 53.21 kg/m   Gen: 46 year old obese female in NAD, resting comfortable Eyes: Proptosis, extraocular movements intact CV: RRR with no murmurs appreciated Pulm: NWOB, CTAB with no crackles, wheezes, or rhonchi GI: Normal bowel sounds present. Soft, Nontender, Nondistended. MSK: no edema, cyanosis, or clubbing noted Skin: warm, dry Neuro: grossly normal, moves all extremities, no tremor Psych: Normal affect and thought content  No results found for this or any previous visit (from the past 72 hour(s)).   Assessment/Plan:  HYPERTENSION, BENIGN SYSTEMIC Currently controlled on hydrochlorothiazide 25 mg daily. Blood pressure today was 124/68 and patient denies any chest pain, shortness of breath, changes in vision or headaches. - Continue hydrochlorothiazide 25 mg daily

## 2016-06-25 NOTE — Patient Instructions (Signed)
Molly White, you were seen today for medication refills.  Your blood pressure looks great today and I have refilled your medication.  Please try to stop smoking.  It was very nice to see you.  Daniel L. Myrtie SomanWarden, MD [facility] Resident PGY-1 06/25/2016 4:22 PM

## 2016-12-28 ENCOUNTER — Telehealth: Payer: Self-pay | Admitting: Family Medicine

## 2016-12-28 NOTE — Telephone Encounter (Signed)
Needs refill on hctz and propanol.  [facility] [address]

## 2016-12-30 ENCOUNTER — Other Ambulatory Visit: Payer: Self-pay | Admitting: Family Medicine

## 2016-12-30 MED ORDER — HYDROCHLOROTHIAZIDE 25 MG PO TABS: 25.0000 mg | ORAL_TABLET | Freq: Every day | ORAL | 5 refills | Status: DC

## 2016-12-30 MED ORDER — PROPRANOLOL HCL 80 MG PO TABS: 80.0000 mg | ORAL_TABLET | Freq: Every day | ORAL | 2 refills | Status: DC

## 2016-12-30 NOTE — Telephone Encounter (Signed)
Refilled

## 2016-12-31 ENCOUNTER — Other Ambulatory Visit: Payer: Self-pay | Admitting: Pharmacist

## 2017-04-01 ENCOUNTER — Ambulatory Visit: Payer: BLUE CROSS/BLUE SHIELD | Admitting: Family Medicine

## 2017-04-01 ENCOUNTER — Other Ambulatory Visit: Payer: Self-pay

## 2017-04-01 ENCOUNTER — Encounter: Payer: Self-pay | Admitting: Family Medicine

## 2017-04-01 VITALS — BP 116/82 | HR 71 | Temp 97.9°F | Ht 64.0 in | Wt 308.2 lb

## 2017-04-01 DIAGNOSIS — H1013 Acute atopic conjunctivitis, bilateral: Secondary | ICD-10-CM | POA: Diagnosis not present

## 2017-04-01 DIAGNOSIS — Z833 Family history of diabetes mellitus: Secondary | ICD-10-CM | POA: Diagnosis not present

## 2017-04-01 DIAGNOSIS — E669 Obesity, unspecified: Secondary | ICD-10-CM

## 2017-04-01 DIAGNOSIS — H5789 Other specified disorders of eye and adnexa: Secondary | ICD-10-CM

## 2017-04-01 LAB — POCT GLYCOSYLATED HEMOGLOBIN (HGB A1C): HEMOGLOBIN A1C: 5.7

## 2017-04-01 MED ORDER — PROPRANOLOL HCL 80 MG PO TABS: 80.0000 mg | ORAL_TABLET | Freq: Every day | ORAL | 2 refills | Status: DC

## 2017-04-01 MED ORDER — FLUTICASONE PROPIONATE 50 MCG/ACT NA SUSP: 2.0000 | Freq: Every day | NASAL | 6 refills | Status: DC

## 2017-04-01 MED ORDER — CETIRIZINE HCL 10 MG PO TABS: 10.0000 mg | ORAL_TABLET | Freq: Every day | ORAL | 11 refills | Status: DC

## 2017-04-01 MED ORDER — HYDROCHLOROTHIAZIDE 25 MG PO TABS: 25.0000 mg | ORAL_TABLET | Freq: Every day | ORAL | 5 refills | Status: DC

## 2017-04-01 MED ORDER — EYE LUBRICANT OP OINT: 0.5000 [in_us] | TOPICAL_OINTMENT | Freq: Every day | OPHTHALMIC | 2 refills | Status: DC

## 2017-04-01 NOTE — Progress Notes (Signed)
Subjective:   Patient ID: Molly White    DOB: 01/10/1971, 47 y.o. female   MRN: [id number]  CC: Itchy watery eyes, screening for diabetes  HPI: Molly White is a 47 y.o. female who presents to clinic today for the following issues.  Allergies Patient reports itchy watery eyes for the past 1-2 years.  She states her symptoms are worse when she goes outside.  Patient is a current everyday smoker.  She denies fever, chills, cough, congestion, runny nose.  She takes Flonase and has run out of this.    Family history of  T1 DM Patient reports her father has type 1 diabetes.   Patient  has recently noticed urinary frequency and drinks a lot of water.  She denies any burning with urination.  She has not had any recent weight change.  Her diet consists primarily of meat and vegetables.  She has recently been working out more, however is unable to [employer] at home and feels like she has been eating worse.  She states she has been craving carbs and frequently snacks on chips in between meals.  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  She belongs to a gym but has not had the time to go.  She has not had any symptoms of hypoglycemia.  Given her family history, urinary frequency and increase in appetite, she would like to be screened for diabetes.   ROS: No fever, chills, nausea, vomiting.  No dizziness, lightheadedness, palpitations.  Social: Patient is a current every day smoker. Medications reviewed. Objective:   BP 116/82   Pulse 71   Temp 97.9 F (36.6 C) (Oral)   Ht 5\' 4"  (1.626 m)   Wt (!) 308 lb 3.2 oz (139.8 kg)   SpO2 97%   BMI 52.90 kg/m  Vitals and nursing note reviewed.  General: 47 year old female, NAD HEENT: EOMI, PERRLA, no tearing, conjunctival injection or discharge present bilaterally,  MMM, o/p is clear Neck: supple, nontender, no LAD CV: RRR no MRG Lungs: CTA B, normal effort Abdomen: soft, NT ND, positive bowel sounds Skin:  warm, dry, no rash Extremities: warm and well perfused, normal tone Neuro: Alert, oriented x3, motor strength 5/5 bilaterally in upper and lower extremities, normal sensation  Assessment & Plan:   Allergic conjunctivitis Signs and symptoms consistent with allergic conjunctivitis.  No systemic symptoms, discharge or signs of viral/bacterial conjunctivitis.  Symptoms worse following outdoor exposure.  Additionally, patient is a current smoker.  Discussed avoidance of triggers. -Will refill Flonase -Rx: Zyrtec 10 mg added -Recommend artificial tears and cold compresses prn -Encourage tobacco sensation  Family history of diabetes mellitus Patient reports symptoms of increased thirst, appetite and urinary frequency.  No recent weight changes.  She would like to be screened for diabetes given her father has h/o type 1 diabetes.  Although I do not suspect UTI at this time, discussed UA to check and pt declines.  -A1c today   Orders Placed This Encounter  Procedures  . POCT glycosylated hemoglobin (Hb A1C)   Meds ordered this encounter  Medications  . fluticasone (FLONASE) 50 MCG/ACT nasal spray    Sig: Place 2 sprays into both nostrils daily.    Dispense:  16 g    Refill:  6  . propranolol (INDERAL) 80 MG tablet    Sig: Take 1 tablet (80 mg total) by mouth daily.    Dispense:  30 tablet  Refill:  2  . hydrochlorothiazide (HYDRODIURIL) 25 MG tablet    Sig: Take 1 tablet (25 mg total) by mouth daily.    Dispense:  30 tablet    Refill:  5  . Artificial Tear Ointment (EYE LUBRICANT) OINT    Sig: Apply 0.5 inches to eye at bedtime.    Dispense:  3.5 g    Refill:  2  . cetirizine (ZYRTEC) 10 MG tablet    Sig: Take 1 tablet (10 mg total) by mouth daily.    Dispense:  30 tablet    Refill:  11    Freddrick MarchYashika Saul Dorsi, MD [facility], PGY-2 04/13/2017 5:41 PM

## 2017-04-01 NOTE — Patient Instructions (Signed)
It was nice meeting you today!  You were seen in clinic for symptoms of allergies.  I have sent in a prescription for Zyrtec 10 mg you can take daily in addition to your Flonase.  This should be able to control most of your symptoms of itchy watery eyes and sneezing.   Additionally, we have screened you for diabetes given your family history and urinary frequency.  I will give you a call regarding the results of this lab test.  Please call clinic if you have any questions.  Be well, Freddrick MarchYashika Shenae Bonanno MD

## 2017-04-13 ENCOUNTER — Encounter: Payer: Self-pay | Admitting: Family Medicine

## 2017-04-13 DIAGNOSIS — Z833 Family history of diabetes mellitus: Secondary | ICD-10-CM | POA: Insufficient documentation

## 2017-04-13 DIAGNOSIS — H1010 Acute atopic conjunctivitis, unspecified eye: Secondary | ICD-10-CM | POA: Insufficient documentation

## 2017-04-13 NOTE — Assessment & Plan Note (Addendum)
Patient reports symptoms of increased thirst, appetite and urinary frequency.  No recent weight changes.  She would like to be screened for diabetes given her father has h/o type 1 diabetes.  Although I do not suspect UTI at this time, discussed UA to check and pt declines.  -A1c today

## 2017-04-13 NOTE — Assessment & Plan Note (Addendum)
Signs and symptoms consistent with allergic conjunctivitis.  No systemic symptoms, discharge or signs of viral/bacterial conjunctivitis.  Symptoms worse following outdoor exposure.  Additionally, patient is a current smoker.  Discussed avoidance of triggers. -Will refill Flonase -Rx: Zyrtec 10 mg added -Recommend artificial tears and cold compresses prn -Encourage tobacco sensation

## 2017-06-14 ENCOUNTER — Telehealth: Payer: Self-pay | Admitting: Pulmonary Disease

## 2017-06-14 ENCOUNTER — Ambulatory Visit: Payer: BLUE CROSS/BLUE SHIELD | Admitting: Pulmonary Disease

## 2017-06-14 ENCOUNTER — Encounter: Payer: Self-pay | Admitting: Pulmonary Disease

## 2017-06-14 DIAGNOSIS — G4733 Obstructive sleep apnea (adult) (pediatric): Secondary | ICD-10-CM

## 2017-06-14 NOTE — Telephone Encounter (Signed)
She can be worked into a 15-minute slot, next available this week
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
## 2017-06-14 NOTE — Addendum Note (Signed)
Addended by: Pilar Grammes on: 06/14/2017 04:41 PM   Modules accepted: Orders

## 2017-06-14 NOTE — Telephone Encounter (Signed)
Spoke with pt, advised her of RA message. Made an appt for her to come in today since RA had an opening at 3:45 today. Nothing further is needed.

## 2017-06-14 NOTE — Patient Instructions (Signed)
Prescription for auto CPAP 10 to 15 cm will be sent to DME
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
## 2017-06-14 NOTE — Assessment & Plan Note (Signed)
Prescription for auto CPAP 10 to 15 cm will be sent to DME.  She will come back for a compliance visit 1 month after using the machine and we will tweak settings as required  Weight loss encouraged, compliance with goal of at least 4-6 hrs every night is the expectation. Advised against medications with sedative side effects Cautioned against driving when sleepy - understanding that sleepiness will vary on a day to day basis

## 2017-06-21 ENCOUNTER — Other Ambulatory Visit: Payer: Self-pay

## 2017-06-22 MED ORDER — PROPRANOLOL HCL 80 MG PO TABS: 80.0000 mg | ORAL_TABLET | Freq: Every day | ORAL | 2 refills | Status: DC

## 2017-06-23 DIAGNOSIS — G4733 Obstructive sleep apnea (adult) (pediatric): Secondary | ICD-10-CM | POA: Diagnosis not present

## 2017-07-21 ENCOUNTER — Encounter: Payer: Self-pay | Admitting: Adult Health

## 2017-07-24 DIAGNOSIS — G4733 Obstructive sleep apnea (adult) (pediatric): Secondary | ICD-10-CM | POA: Diagnosis not present

## 2017-08-16 ENCOUNTER — Ambulatory Visit: Payer: BLUE CROSS/BLUE SHIELD | Admitting: Adult Health

## 2017-08-18 ENCOUNTER — Ambulatory Visit (INDEPENDENT_AMBULATORY_CARE_PROVIDER_SITE_OTHER): Payer: BLUE CROSS/BLUE SHIELD | Admitting: Adult Health

## 2017-08-18 ENCOUNTER — Encounter: Payer: Self-pay | Admitting: Adult Health

## 2017-08-18 DIAGNOSIS — G4733 Obstructive sleep apnea (adult) (pediatric): Secondary | ICD-10-CM

## 2017-08-18 NOTE — Assessment & Plan Note (Signed)
Very severe OSA with excellent control and compliance on CPAP   Plan  Patient Instructions  Keep up good work . Great job .  Continue CPAP each night Work on healthy weight Do not drive if  sleepy Adjust CPAP pressure 10 to 16 cm H2O Follow up with Dr. Vassie LollAlva in 1 year and as needed

## 2017-08-18 NOTE — Assessment & Plan Note (Signed)
Wt loss  
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
## 2017-08-18 NOTE — Patient Instructions (Signed)
Keep up good work . Great job .  Continue CPAP each night Work on healthy weight Do not drive if  sleepy Adjust CPAP pressure 10 to 16 cm H2O Follow up with Dr. Vassie LollAlva in 1 year and as needed

## 2017-08-23 DIAGNOSIS — G4733 Obstructive sleep apnea (adult) (pediatric): Secondary | ICD-10-CM | POA: Diagnosis not present

## 2017-09-19 ENCOUNTER — Other Ambulatory Visit: Payer: Self-pay

## 2017-09-19 MED ORDER — PROPRANOLOL HCL 80 MG PO TABS: 80.0000 mg | ORAL_TABLET | Freq: Every day | ORAL | 2 refills | Status: DC

## 2017-09-20 ENCOUNTER — Other Ambulatory Visit: Payer: Self-pay

## 2017-09-20 MED ORDER — HYDROCHLOROTHIAZIDE 25 MG PO TABS: 25.0000 mg | ORAL_TABLET | Freq: Every day | ORAL | 5 refills | Status: DC

## 2017-09-21 ENCOUNTER — Other Ambulatory Visit: Payer: Self-pay

## 2017-09-21 MED ORDER — PROPRANOLOL HCL 80 MG PO TABS: 80.0000 mg | ORAL_TABLET | Freq: Every day | ORAL | 1 refills | Status: DC

## 2017-09-23 DIAGNOSIS — G4733 Obstructive sleep apnea (adult) (pediatric): Secondary | ICD-10-CM | POA: Diagnosis not present

## 2017-10-24 DIAGNOSIS — G4733 Obstructive sleep apnea (adult) (pediatric): Secondary | ICD-10-CM | POA: Diagnosis not present

## 2017-10-27 DIAGNOSIS — G4733 Obstructive sleep apnea (adult) (pediatric): Secondary | ICD-10-CM | POA: Diagnosis not present

## 2017-11-23 DIAGNOSIS — G4733 Obstructive sleep apnea (adult) (pediatric): Secondary | ICD-10-CM | POA: Diagnosis not present

## 2017-12-26 DIAGNOSIS — G4733 Obstructive sleep apnea (adult) (pediatric): Secondary | ICD-10-CM | POA: Diagnosis not present

## 2018-01-19 ENCOUNTER — Other Ambulatory Visit: Payer: Self-pay

## 2018-01-19 MED ORDER — FLUTICASONE PROPIONATE 50 MCG/ACT NA SUSP: 2.0000 | Freq: Every day | NASAL | 6 refills | Status: DC

## 2018-01-23 DIAGNOSIS — G4733 Obstructive sleep apnea (adult) (pediatric): Secondary | ICD-10-CM | POA: Diagnosis not present

## 2018-02-22 DIAGNOSIS — G4733 Obstructive sleep apnea (adult) (pediatric): Secondary | ICD-10-CM | POA: Diagnosis not present

## 2018-03-18 ENCOUNTER — Other Ambulatory Visit: Payer: Self-pay | Admitting: Family Medicine

## 2018-04-17 ENCOUNTER — Other Ambulatory Visit: Payer: Self-pay | Admitting: Family Medicine

## 2018-04-17 MED ORDER — CETIRIZINE HCL 10 MG PO TABS: 10.0000 mg | ORAL_TABLET | Freq: Every day | ORAL | 11 refills | Status: DC

## 2018-06-15 DIAGNOSIS — G4733 Obstructive sleep apnea (adult) (pediatric): Secondary | ICD-10-CM | POA: Diagnosis not present

## 2018-08-09 ENCOUNTER — Other Ambulatory Visit: Payer: Self-pay | Admitting: *Deleted

## 2018-08-09 DIAGNOSIS — Z20822 Contact with and (suspected) exposure to covid-19: Secondary | ICD-10-CM

## 2018-08-13 LAB — NOVEL CORONAVIRUS, NAA: SARS-CoV-2, NAA: NOT DETECTED

## 2018-09-14 ENCOUNTER — Other Ambulatory Visit: Payer: Self-pay

## 2018-09-14 MED ORDER — PROPRANOLOL HCL 80 MG PO TABS: ORAL_TABLET | ORAL | 1 refills | Status: DC

## 2018-10-16 ENCOUNTER — Other Ambulatory Visit: Payer: Self-pay

## 2018-10-16 MED ORDER — HYDROCHLOROTHIAZIDE 25 MG PO TABS: ORAL_TABLET | ORAL | 2 refills | Status: DC

## 2019-02-08 ENCOUNTER — Other Ambulatory Visit: Payer: Self-pay | Admitting: *Deleted

## 2019-02-09 MED ORDER — FLUTICASONE PROPIONATE 50 MCG/ACT NA SUSP: 2.0000 | Freq: Every day | NASAL | 0 refills | Status: DC

## 2019-03-05 ENCOUNTER — Ambulatory Visit (INDEPENDENT_AMBULATORY_CARE_PROVIDER_SITE_OTHER): Payer: 59 | Admitting: Student in an Organized Health Care Education/Training Program

## 2019-03-05 ENCOUNTER — Encounter: Payer: Self-pay | Admitting: Student in an Organized Health Care Education/Training Program

## 2019-03-05 ENCOUNTER — Other Ambulatory Visit: Payer: Self-pay

## 2019-03-05 DIAGNOSIS — F419 Anxiety disorder, unspecified: Secondary | ICD-10-CM | POA: Diagnosis not present

## 2019-03-05 DIAGNOSIS — Z0000 Encounter for general adult medical examination without abnormal findings: Secondary | ICD-10-CM | POA: Diagnosis not present

## 2019-03-05 MED ORDER — CITALOPRAM HYDROBROMIDE 20 MG PO TABS: 20.0000 mg | ORAL_TABLET | Freq: Every day | ORAL | 0 refills | Status: DC

## 2019-03-05 NOTE — Progress Notes (Signed)
   CHIEF COMPLAINT / HPI: anxiety  Otherwise well 49yo female presents with gradual worsening anxiety over the past year. The anxiety is associated mostly with driving but she denies any unusual driving related accidents or trauma in her past. She thinks that it may have began when her brother passed away suddenly ten years ago but that was also not a car related incident. She thinks perhaps it's just the feeling of not being in control that gives her anxiety. She will have feelings of uneasiness when thinking about being in a car and in preparation for driving. It does not matter if she is the driver or passenger. 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 Her family lives an hour away and this anxiety has prevented her from seeing them as frequently as she would like. The anxiety has not interfered with her ability to complete her work. Denies any episodes of panic (ie no rapid heart rate, respiratory distress, out of control feelings). She has not tried counseling in the past. She takes an herbal supplement to help with anxiety but does not think that it helps any more.    PERTINENT  PMH / PSH: Graves disease and h/o toxic diffuse goiter. Last TSH checked 01/2015   OBJECTIVE: BP 110/76   Pulse 93   Ht 5\' 4"  (1.626 m)   Wt (!) 316 lb 8 oz (143.6 kg)   SpO2 97%   BMI 54.33 kg/m   General: NAD, pleasant, able to participate in exam Thyroid: negative for thyromegaly Cardiac: RRR, normal heart sounds, no murmurs. Negative LE edema Respiratory: CTAB, normal effort, No wheezes, rales or rhonchi Abdomen: soft, nontender, nondistended, no hepatic or splenomegaly Skin: warm and dry, no rashes noted Neuro: alert and oriented x4, no focal deficits Psych: Normal affect and mood  ASSESSMENT / PLAN:  Anxiety Anxiety specific to car driving/riding without panic attacks.  GAD and PHQ-9 given during encounter.  Physical exam normal. Long discussion with patient on treatment  options. She elected to try combination of therapy and medication. Referred to Dr. for counseling, prescribed citalopram after discussion on medication compliance, common side effects and indications to discontinue medication immediately. Provided patient with information on stress management strategies. Asked to return in 2-3 weeks for follow up. After our appointment, I realized her symptoms could potentially be 2/2 hyperthyroid  (TSH last checked 2017 with history of graves) so I am highly recommending getting TSH at that follow up appointment if not with me.  I'll put in a future order so she can have lab drawn if coming in earlier for counseling.   Healthcare maintenance Pap smear and IUD removal at next appointment     2018, DO [facility]

## 2019-03-05 NOTE — Patient Instructions (Signed)
It was a pleasure to see you today!  To summarize our discussion for this visit:  Thank you for coming in to see me.  I am sorry to hear that you have been having difficulty with anxiety.  We discussed different options for treatment of this today and decided on a plan to try a new medication and counseling.  I have sent the medication to your pharmacy.  Please come back to see me in 2-3 weeks so I can see how you are feeling on it.  If you experience any severe symptoms, please stop taking the medicine and let me know immediately.  I am also sending a message to our therapist here so that we can get some counseling started for you.  Come back anytime so we can do your Pap smear and remove your IUD.  Some additional health maintenance measures we should update are: Health Maintenance Due  Topic Date Due  . PAP SMEAR-Modifier  02/04/2018  .    Please return to our clinic to see me in 2 to 3 weeks.  Call the clinic at [phone] if your symptoms worsen or you have any concerns.   Thank you for allowing me to take part in your care,  Dr. Jamelle Rushing   Managing Anxiety, Adult After being diagnosed with an anxiety disorder, you may be relieved to know why you have felt or behaved a certain way. You may also feel overwhelmed about the treatment ahead and what it will mean for your life. With care and support, you can manage this condition and recover from it. How to manage lifestyle changes Managing stress and anxiety  Stress is your body's reaction to life changes and events, both good and bad. Most stress will last just a few hours, but stress can be ongoing and can lead to more than just stress. Although stress can play a major role in anxiety, it is not the same as anxiety. Stress is usually caused by something external, such as a deadline, test, or competition. Stress normally passes after the triggering event has ended.  Anxiety is caused by something internal, such as  imagining a terrible outcome or worrying that something will go wrong that will devastate you. Anxiety often does not go away even after the triggering event is over, and it can become long-term (chronic) worry. It is important to understand the differences between stress and anxiety and to manage your stress effectively so that it does not lead to an anxious response. Talk with your health care provider or a counselor to learn more about reducing anxiety and stress. He or she may suggest tension reduction techniques, such as:  Music therapy. This can include creating or listening to music that you enjoy and that inspires you.  Mindfulness-based meditation. This involves being aware of your normal breaths while not trying to control your breathing. It can be done while sitting or walking.  Centering prayer. This involves focusing on a word, phrase, or sacred image that means something to you and brings you peace.  Deep breathing. To do this, expand your stomach and inhale slowly through your nose. Hold your breath for 3-5 seconds. Then exhale slowly, letting your stomach muscles relax.  Self-talk. This involves identifying thought patterns that lead to anxiety reactions and changing those patterns.  Muscle relaxation. This involves tensing muscles and then relaxing them. Choose a tension reduction technique that suits your lifestyle and personality. These techniques take time and practice. Set aside 5-15 minutes a  day to do them. Therapists can offer counseling and training in these techniques. The training to help with anxiety may be covered by some insurance plans. Other things you can do to manage stress and anxiety include:  Keeping a stress/anxiety diary. This can help you learn what triggers your reaction and then learn ways to manage your response.  Thinking about how you react to certain situations. You may not be able to control everything, but you can control your response.  Making time  for activities that help you relax and not feeling guilty about spending your time in this way.  Visual imagery and yoga can help you stay calm and relax.  Medicines Medicines can help ease symptoms. Medicines for anxiety include:  Anti-anxiety drugs.  Antidepressants. Medicines are often used as a primary treatment for anxiety disorder. Medicines will be prescribed by a health care provider. When used together, medicines, psychotherapy, and tension reduction techniques may be the most effective treatment. Relationships Relationships can play a big part in helping you recover. Try to spend more time connecting with trusted friends and family members. Consider going to couples counseling, taking family education classes, or going to family therapy. Therapy can help you and others better understand your condition. How to recognize changes in your anxiety Everyone responds differently to treatment for anxiety. Recovery from anxiety happens when symptoms decrease and stop interfering with your daily activities at home or work. This may mean that you will start to:  Have better concentration and focus. Worry will interfere less in your daily thinking.  Sleep better.  Be less irritable.  Have more energy.  Have improved memory. It is important to recognize when your condition is getting worse. Contact your health care provider if your symptoms interfere with home or work and you feel like your condition is not improving. Follow these instructions at home: Activity  Exercise. Most adults should do the following: ? Exercise for at least 150 minutes each week. The exercise should increase your heart rate and make you sweat (moderate-intensity exercise). ? Strengthening exercises at least twice a week.  Get the right amount and quality of sleep. Most adults need 7-9 hours of sleep each night. Lifestyle   Eat a healthy diet that includes plenty of vegetables, fruits, whole grains, low-fat  dairy products, and lean protein. Do not eat a lot of foods that are high in solid fats, added sugars, or salt.  Make choices that simplify your life.  Do not use any products that contain nicotine or tobacco, such as cigarettes, e-cigarettes, and chewing tobacco. If you need help quitting, ask your health care provider.  Avoid caffeine, alcohol, and certain over-the-counter cold medicines. These may make you feel worse. Ask your pharmacist which medicines to avoid. General instructions  Take over-the-counter and prescription medicines only as told by your health care provider.  Keep all follow-up visits as told by your health care provider. This is important. Where to find support You can get help and support from these sources:  Self-help groups.  Online and [employer].  A trusted spiritual leader.  Couples counseling.  Family education classes.  Family therapy. Where to find more information You may find that joining a support group helps you deal with your anxiety. The following sources can help you locate counselors or support groups near you:  [place]: www.mentalhealth[place].net  Anxiety and Depression Association of [place] (ADAA): https://www.clark.net/  National Alliance on Mental Illness (NAMI): www.nami.org Contact a health care provider if  you:  Have a hard time staying focused or finishing daily tasks.  Spend many hours a day feeling worried about everyday life.  Become exhausted by worry.  Start to have headaches, feel tense, or have nausea.  Urinate more than normal.  Have diarrhea. Get help right away if you have:  A racing heart and shortness of breath.  Thoughts of hurting yourself or others. If you ever feel like you may hurt yourself or others, or have thoughts about taking your own life, get help right away. You can go to your nearest emergency department or call:  Your local emergency services (911 in the [place]  A  suicide crisis helpline, such as the National Suicide Prevention Lifeline at 205-019-7682. This is open 24 hours a day. Summary  Taking steps to learn and use tension reduction techniques can help calm you and help prevent triggering an anxiety reaction.  When used together, medicines, psychotherapy, and tension reduction techniques may be the most effective treatment.  Family, friends, and partners can play a big part in helping you recover from an anxiety disorder. This information is not intended to replace advice given to you by your health care provider. Make sure you discuss any questions you have with your health care provider. Document Revised: 06/13/2018 Document Reviewed: 06/13/2018 Elsevier Patient Education  2020 [employer].

## 2019-03-06 ENCOUNTER — Telehealth: Payer: Self-pay | Admitting: Psychology

## 2019-03-06 NOTE — Telephone Encounter (Signed)
Called and left VM to set up appt with Dr. Shawnee Knapp for [facility] appt

## 2019-03-07 DIAGNOSIS — Z01419 Encounter for gynecological examination (general) (routine) without abnormal findings: Secondary | ICD-10-CM | POA: Insufficient documentation

## 2019-03-07 DIAGNOSIS — F419 Anxiety disorder, unspecified: Secondary | ICD-10-CM | POA: Insufficient documentation

## 2019-03-07 NOTE — Addendum Note (Signed)
Addended by: Leeroy Bock on: 03/07/2019 02:44 PM   Modules accepted: Orders

## 2019-03-07 NOTE — Assessment & Plan Note (Signed)
Pap smear and IUD removal at next appointment

## 2019-03-07 NOTE — Assessment & Plan Note (Addendum)
Anxiety specific to car driving/riding without panic attacks.  GAD and PHQ-9 given during encounter.  Physical exam normal. Long discussion with patient on treatment options. She elected to try combination of therapy and medication. Referred to Dr. Shawnee Knapp for counseling, prescribed citalopram after discussion on medication compliance, common side effects and indications to discontinue medication immediately. Provided patient with information on stress management strategies. Asked to return in 2-3 weeks for follow up. After our appointment, I realized her symptoms could potentially be 2/2 hyperthyroid  (TSH last checked 2017 with history of graves) so I am highly recommending getting TSH at that follow up appointment if not with me.  I'll put in a future order so she can have lab drawn if coming in earlier for counseling.

## 2019-03-15 ENCOUNTER — Other Ambulatory Visit: Payer: Self-pay | Admitting: Student in an Organized Health Care Education/Training Program

## 2019-04-02 ENCOUNTER — Telehealth: Payer: Self-pay | Admitting: Student in an Organized Health Care Education/Training Program

## 2019-04-02 NOTE — Telephone Encounter (Signed)
Pt states she needs a refill on Citalopram before she sees doctor on the 23. Also need refills on all meds. Thanks

## 2019-04-03 MED ORDER — CITALOPRAM HYDROBROMIDE 20 MG PO TABS: 20.0000 mg | ORAL_TABLET | Freq: Every day | ORAL | 0 refills | Status: DC

## 2019-04-03 MED ORDER — HYDROCHLOROTHIAZIDE 25 MG PO TABS: ORAL_TABLET | ORAL | 2 refills | Status: DC

## 2019-04-03 NOTE — Telephone Encounter (Signed)
Refilled citalopram and HCTZ. Follow up appointment scheduled to discuss medications soon.

## 2019-04-17 ENCOUNTER — Other Ambulatory Visit: Payer: Self-pay

## 2019-04-17 ENCOUNTER — Encounter: Payer: Self-pay | Admitting: Student in an Organized Health Care Education/Training Program

## 2019-04-17 ENCOUNTER — Other Ambulatory Visit: Payer: Self-pay | Admitting: Student in an Organized Health Care Education/Training Program

## 2019-04-17 ENCOUNTER — Ambulatory Visit (INDEPENDENT_AMBULATORY_CARE_PROVIDER_SITE_OTHER): Payer: 59 | Admitting: Student in an Organized Health Care Education/Training Program

## 2019-04-17 VITALS — BP 122/74 | HR 91 | Ht 65.0 in | Wt 310.0 lb

## 2019-04-17 DIAGNOSIS — Z780 Asymptomatic menopausal state: Secondary | ICD-10-CM

## 2019-04-17 DIAGNOSIS — E0500 Thyrotoxicosis with diffuse goiter without thyrotoxic crisis or storm: Secondary | ICD-10-CM

## 2019-04-17 DIAGNOSIS — F419 Anxiety disorder, unspecified: Secondary | ICD-10-CM

## 2019-04-17 DIAGNOSIS — Z01419 Encounter for gynecological examination (general) (routine) without abnormal findings: Secondary | ICD-10-CM

## 2019-04-17 DIAGNOSIS — R739 Hyperglycemia, unspecified: Secondary | ICD-10-CM

## 2019-04-17 DIAGNOSIS — I10 Essential (primary) hypertension: Secondary | ICD-10-CM

## 2019-04-17 NOTE — Patient Instructions (Signed)
It was a pleasure to see you today!  To summarize our discussion for this visit:  I am glad to hear that you are doing better today.  With your history of Graves' disease we are going to check your thyroid hormones to make sure this is not contributing to your anxiety symptoms.  We will also check other labs related to high blood pressure.  Additionally, to monitor your bone health we can get a vitamin D level and I ordered a DEXA scan which can be completed at the breast center at [facility]  Please make an appointment at the front desk for your Pap smear  Some additional health maintenance measures we should update are: Health Maintenance Due  Topic Date Due  . PAP SMEAR-Modifier  02/04/2018  .    Please return to our clinic to see me at your earliest convenience.  Call the clinic at [phone] if your symptoms worsen or you have any concerns.   Thank you for allowing me to take part in your care,  Dr. Jamelle Rushing

## 2019-04-17 NOTE — Assessment & Plan Note (Addendum)
Diagnosed 1996. S/p radio ablation x 2. Patient told that ablation was not completely effective. Current thyroid status unknown as of 07/27/11. Currently taking propranolol daily. Testing TSH, TrAb, CMP, CBC today. Returned normal.  We will continue to monitor at least yearly.  Exam without red flags Continue propranolol

## 2019-04-17 NOTE — Assessment & Plan Note (Addendum)
Began citalopram last appointment Repeat GAD/PHQ-9 today improved from previous Patient still symptomatic and requesting improvement Ruled out thyroid involvement and patient requested increased dose of medication We will increase citalopram to 40 mg daily and recheck in 1 month CBC/CMP today

## 2019-04-17 NOTE — Progress Notes (Signed)
    SUBJECTIVE:   CHIEF COMPLAINT / HPI: Follow-up  Toenails-approximately 2 months ago patient had a pedicure in which she thinks that the salon worker cut her toenails too short.  She began experiencing some pain in her first digits bilaterally.  She has had 1 pedicure since then which seems to have improved the pain gradually.  Still notices some pain when applying pressure to her big toenails on the medial side.  However, denies any erythema, draining, swelling.  Does not interfere with ambulation.  She has not tried any at home remedies.  Anxiety-patient has been adherent with her citalopram daily which she takes in the morning.  She thinks that this is improved her anxiety especially when driving.  She feels that she is more comfortable going out and working.  Has noticed having a little bit more difficulty falling asleep at night but started taking 10 mg melatonin nightly which has resolved the issue.  Did not ever have contact with counselor but is still open to this plan.  She feels that she would like to increase her dose to get more effective.  Graves Dz-patient has history of Graves' disease for which she takes propranolol and feels asymptomatic.  However, has not had labs checked in several years and had incomplete ablation in the past.  Unknown if these abnormalities could be contributing to her current symptoms of anxiety.  Denies any known throat or eye swelling.  pap-made patient aware that she is due for regular cervical cancer screening plan to return for this appointment.  PERTINENT  PMH / PSH: graves Dz, anxiety, HTN  OBJECTIVE:   BP 122/74   Pulse 91   Ht 5\' 5"  (1.651 m)   Wt (!) 310 lb (140.6 kg)   BMI 51.59 kg/m   General: NAD, obese, obese, pleasant, able to participate in exam Cardiac: RRR, normal heart sounds, no murmurs. 2+ radial and PT pulses bilaterally Neck: Thyroid symmetrical and mildly enlarged, nontender to palpation, mobile, no nodules Respiratory:  CTAB, normal effort, No wheezes, rales or rhonchi Extremities: no edema or cyanosis. WWP.  Bilateral great toes negative for erythema, swelling, drainage however, do have mild tenderness with pressure to medial toenail.  Does not appear to be grossly ingrown or paronychia Skin: warm and dry, no rashes noted Neuro: alert and oriented x4, no focal deficits Psych: Normal affect and mood   ASSESSMENT/PLAN:   Graves disease Diagnosed 1996. S/p radio ablation x 2. Patient told that ablation was not completely effective. Current thyroid status unknown as of 07/27/11. Currently taking propranolol daily. Testing TSH, TrAb, CMP, CBC today. Returned normal.  We will continue to monitor at least yearly.  Exam without red flags Continue propranolol   Anxiety Began citalopram last appointment Repeat GAD/PHQ-9 today improved from previous Patient still symptomatic and requesting improvement Ruled out thyroid involvement and patient requested increased dose of medication We will increase citalopram to 40 mg daily and recheck in 1 month CBC/CMP today  Well woman exam Due for Pap smear will complete at next appointment  HYPERTENSION, BENIGN SYSTEMIC Well-controlled on current medication CMP showed regular kidney function  Hyperglycemia Nonfasting glucose mildly elevated on BMP Check A1c at next appointment Patient was notified     09/27/11, DO [facility]

## 2019-04-18 LAB — COMPREHENSIVE METABOLIC PANEL
ALT: 34 IU/L — ABNORMAL HIGH (ref 0–32)
AST: 27 IU/L (ref 0–40)
Albumin/Globulin Ratio: 1.5 (ref 1.2–2.2)
Albumin: 4.3 g/dL (ref 3.8–4.8)
Alkaline Phosphatase: 63 IU/L (ref 39–117)
BUN/Creatinine Ratio: 8 — ABNORMAL LOW (ref 9–23)
BUN: 8 mg/dL (ref 6–24)
Bilirubin Total: 0.4 mg/dL (ref 0.0–1.2)
CO2: 26 mmol/L (ref 20–29)
Calcium: 10.2 mg/dL (ref 8.7–10.2)
Chloride: 99 mmol/L (ref 96–106)
Creatinine, Ser: 1.02 mg/dL — ABNORMAL HIGH (ref 0.57–1.00)
GFR calc Af Amer: 75 mL/min/{1.73_m2} (ref >59)
GFR calc non Af Amer: 65 mL/min/{1.73_m2} (ref >59)
Globulin, Total: 2.9 g/dL (ref 1.5–4.5)
Glucose: 114 mg/dL — ABNORMAL HIGH (ref 65–99)
Potassium: 3.7 mmol/L (ref 3.5–5.2)
Sodium: 140 mmol/L (ref 134–144)
Total Protein: 7.2 g/dL (ref 6.0–8.5)

## 2019-04-18 LAB — CBC
Hematocrit: 48.4 % — ABNORMAL HIGH (ref 34.0–46.6)
Hemoglobin: 16.5 g/dL — ABNORMAL HIGH (ref 11.1–15.9)
MCH: 32.7 pg (ref 26.6–33.0)
MCHC: 34.1 g/dL (ref 31.5–35.7)
MCV: 96 fL (ref 79–97)
Platelets: 198 10*3/uL (ref 150–450)
RBC: 5.04 x10E6/uL (ref 3.77–5.28)
RDW: 12.5 % (ref 11.7–15.4)
WBC: 4.4 10*3/uL (ref 3.4–10.8)

## 2019-04-18 LAB — THYROTROPIN RECEPTOR AUTOABS: Thyrotropin Receptor Ab: <1.1 IU/L (ref 0.00–1.75)

## 2019-04-18 LAB — TSH: TSH: 1.8 u[IU]/mL (ref 0.450–4.500)

## 2019-04-18 LAB — VITAMIN D 25 HYDROXY (VIT D DEFICIENCY, FRACTURES): Vit D, 25-Hydroxy: 9.4 ng/mL — ABNORMAL LOW (ref 30.0–100.0)

## 2019-04-19 ENCOUNTER — Other Ambulatory Visit: Payer: Self-pay | Admitting: Student in an Organized Health Care Education/Training Program

## 2019-04-19 DIAGNOSIS — Z780 Asymptomatic menopausal state: Secondary | ICD-10-CM

## 2019-04-19 DIAGNOSIS — R739 Hyperglycemia, unspecified: Secondary | ICD-10-CM | POA: Insufficient documentation

## 2019-04-19 MED ORDER — CITALOPRAM HYDROBROMIDE 40 MG PO TABS: 40.0000 mg | ORAL_TABLET | Freq: Every day | ORAL | 1 refills | Status: DC

## 2019-04-19 MED ORDER — VITAMIN D (ERGOCALCIFEROL) 1.25 MG (50000 UNIT) PO CAPS: 50000.0000 [IU] | ORAL_CAPSULE | ORAL | 0 refills | Status: DC

## 2019-04-19 NOTE — Assessment & Plan Note (Signed)
Well-controlled on current medication CMP showed regular kidney function

## 2019-04-19 NOTE — Assessment & Plan Note (Signed)
Nonfasting glucose mildly elevated on BMP Check A1c at next appointment Patient was notified

## 2019-04-19 NOTE — Assessment & Plan Note (Signed)
Due for Pap smear will complete at next appointment

## 2019-05-10 ENCOUNTER — Other Ambulatory Visit: Payer: Self-pay | Admitting: Student in an Organized Health Care Education/Training Program

## 2019-05-17 ENCOUNTER — Other Ambulatory Visit: Payer: Self-pay

## 2019-05-17 MED ORDER — CETIRIZINE HCL 10 MG PO TABS: 10.0000 mg | ORAL_TABLET | Freq: Every day | ORAL | 2 refills | Status: DC

## 2019-05-22 ENCOUNTER — Encounter: Payer: Self-pay | Admitting: Student in an Organized Health Care Education/Training Program

## 2019-05-22 ENCOUNTER — Other Ambulatory Visit: Payer: Self-pay

## 2019-05-22 ENCOUNTER — Other Ambulatory Visit (HOSPITAL_COMMUNITY)
Admission: RE | Admit: 2019-05-22 | Discharge: 2019-05-22 | Disposition: A | Discharge status: Home / Self Care | Payer: 59 | Source: Ambulatory Visit | Attending: Family Medicine | Admitting: Family Medicine

## 2019-05-22 ENCOUNTER — Ambulatory Visit (INDEPENDENT_AMBULATORY_CARE_PROVIDER_SITE_OTHER): Payer: 59 | Admitting: Student in an Organized Health Care Education/Training Program

## 2019-05-22 VITALS — BP 130/75 | HR 79 | Temp 98.6°F | Wt 306.0 lb

## 2019-05-22 DIAGNOSIS — Z124 Encounter for screening for malignant neoplasm of cervix: Secondary | ICD-10-CM | POA: Diagnosis present

## 2019-05-22 DIAGNOSIS — Z1211 Encounter for screening for malignant neoplasm of colon: Secondary | ICD-10-CM | POA: Diagnosis not present

## 2019-05-22 NOTE — Progress Notes (Deleted)
    SUBJECTIVE:   CHIEF COMPLAINT / HPI:   ***  PERTINENT  PMH / PSH: ***  OBJECTIVE:   BP 130/75   Pulse 79   Temp 98.6 F (37 C) (Oral)   Wt (!) 306 lb (138.8 kg)   SpO2 96%   BMI 50.92 kg/m   ***  ASSESSMENT/PLAN:   No problem-specific Assessment & Plan notes found for this encounter.     Leeroy Bock, DO [facility]

## 2019-05-22 NOTE — Patient Instructions (Signed)
It was a pleasure to see you today!  To summarize our discussion for this visit:  Your pap results should be back in a few days and I will notify you of the results.   I have sent in a referral to GI for a colonoscopy. Please let me know if you do not hear from them in the next couple weeks.  Some additional health maintenance measures we should update are: Health Maintenance Due  Topic Date Due  . COVID-19 Vaccine (1) Never done  . PAP SMEAR-Modifier  02/04/2018  .    Please return to our clinic to see me in about 3-4 months so we can check in on your chronic conditions.  Call the clinic at [phone] if your symptoms worsen or you have any concerns.   Thank you for allowing me to take part in your care,  Dr. Doristine Mango   Colorectal Cancer Screening  Colorectal cancer screening is a group of tests that are used to check for colorectal cancer before symptoms develop. Colorectal refers to the colon and rectum. The colon and rectum are located at the end of the digestive tract and carry bowel movements out of the body. Who should have screening? All adults starting at age 1 until age 60 should have screening. Your health care provider may recommend screening at age 90. You will have tests every 1-10 years, depending on your results and the type of screening test. You may have screening tests starting at an earlier age, or more frequently than other people, if you have any of the following risk factors:  A personal or family history of colorectal cancer or abnormal growths (polyps).  Inflammatory bowel disease, such as ulcerative colitis or Crohn's disease.  A history of having radiation treatment to the abdomen or pelvic area for cancer.  Colorectal cancer symptoms, such as changes in bowel habits or blood in your stool.  A type of colon cancer syndrome that is passed from parent to child (hereditary), such as: ? Lynch syndrome. ? Familial adenomatous polyposis. ?  Turcot syndrome. ? Peutz-Jeghers syndrome. Screening recommendations for adults who are 66-44 years old vary depending on health. How is screening done? There are several types of colorectal screening tests. You may have one or more of the following:  Guaiac-based fecal occult blood testing. For this test, a stool (feces) sample is checked for hidden (occult) blood, which could be a sign of colorectal cancer.  Fecal immunochemical test (FIT). For this test, a stool sample is checked for blood, which could be a sign of colorectal cancer.  Stool DNA test. For this test, a stool sample is checked for blood and changes in DNA that could lead to colorectal cancer.  Sigmoidoscopy. During this test, a thin, flexible tube with a camera on the end (sigmoidoscope) is used to examine the rectum and the lower colon.  Colonoscopy. During this test, a long, flexible tube with a camera on the end (colonoscope) is used to examine the entire colon and rectum. With a colonoscopy, it is possible to take a sample of tissue (biopsy) and remove small polyps during the test.  Virtual colonoscopy. Instead of a colonoscope, this type of colonoscopy uses X-rays (CT scan) and computers to produce images of the colon and rectum. What are the benefits of screening? Screening reduces your risk for colorectal cancer and can help identify cancer at an early stage, when the cancer can be removed or treated more easily. It is common for polyps to  form in the lining of the colon, especially as you age. These polyps may be cancerous or become cancerous over time. Screening can identify these polyps. What are the risks of screening? Each screening test may have different risks.  Stool sample tests have fewer risks than other types of screening tests. However, you may need more tests to confirm results from a stool sample test.  Screening tests that involve X-rays expose you to low levels of radiation, which may slightly increase  your cancer risk. The benefit of detecting cancer outweighs the slight increase in risk.  Screening tests such as sigmoidoscopy and colonoscopy may place you at risk for bleeding, intestinal damage, infection, or a reaction to medicines given during the exam. Talk with your health care provider to understand your risk for colorectal cancer and to make a screening plan that is right for you. Questions to ask your health care provider  When should I start colorectal cancer screening?  What is my risk for colorectal cancer?  How often do I need screening?  Which screening tests do I need?  How do I get my test results?  What do my results mean? Where to find more information Learn more about colorectal cancer screening from:  The American Cancer Society: www.cancer.org  The [employer]: www.cancer.gov Summary  Colorectal cancer screening is a group of tests used to check for colorectal cancer before symptoms develop.  Screening reduces your risk for colorectal cancer and can help identify cancer at an early stage, when the cancer can be removed or treated more easily.  All adults starting at age 54 until age 83 should have screening. Your health care provider may recommend screening at age 51.  You may have screening tests starting at an earlier age, or more frequently than other people, if you have certain risk factors.  Talk with your health care provider to understand your risk for colorectal cancer and to make a screening plan that is right for you. This information is not intended to replace advice given to you by your health care provider. Make sure you discuss any questions you have with your health care provider. Document Revised: 05/03/2018 Document Reviewed: 10/13/2016 Elsevier Patient Education  2020 [employer].

## 2019-05-22 NOTE — Progress Notes (Signed)
    SUBJECTIVE:   CHIEF COMPLAINT / HPI: routine cervical cancer screening  Patient has no concerns at this time. Denies abnormal discharge, bleeding, pain. Here for routine pap screening and requesting STD add on.  Additionally, patient is inquiring about colon cancer screening. She denies having any known family history of colon cancer. We discussed guideline recommendations and she would like to proceed at this time for screening. Put in for Gi referral for colonoscopy.  PERTINENT  PMH / PSH: graves dz, HTN, OSA, obesity. Well-woman exam completed previously this year.  OBJECTIVE:   BP 130/75   Pulse 79   Temp 98.6 F (37 C) (Oral)   Wt (!) 306 lb (138.8 kg)   SpO2 96%   BMI 50.92 kg/m   General: NAD, pleasant, able to participate in exam Abdomen: soft, nontender, nondistended, no hepatic or splenomegaly, +BS Pelvis: negative for external lesions. No abnormal discharge. Cervix without visible lesions. IUD wires from os appear to be in place.  Bimanual exam negative for tenderness or adnexal masses but limited by body habitus.  Extremities: no edema or cyanosis. WWP. Skin: warm and dry, no rashes noted Neuro: alert and oriented x4, no focal deficits Psych: Normal affect and mood  ASSESSMENT/PLAN:   Cervical cancer screening Previous testing reviewed and last was negative for HPV/abnormal pathology. Pap/pelvic exam performed without abnormalities.   Colon cancer screening 49yo AA female, Average risk, no FM Hx. Patient prefers to initiate screening at this time which I think is reasonable. Referred to GI for colonoscopy      Leeroy Bock, DO [facility]

## 2019-05-23 ENCOUNTER — Encounter: Payer: Self-pay | Admitting: Gastroenterology

## 2019-05-24 DIAGNOSIS — Z1211 Encounter for screening for malignant neoplasm of colon: Secondary | ICD-10-CM | POA: Insufficient documentation

## 2019-05-24 DIAGNOSIS — Z124 Encounter for screening for malignant neoplasm of cervix: Secondary | ICD-10-CM | POA: Insufficient documentation

## 2019-05-24 LAB — CYTOLOGY - PAP
Chlamydia: NEGATIVE
Comment: NEGATIVE
Comment: NEGATIVE
Comment: NEGATIVE
Comment: NORMAL
Diagnosis: NEGATIVE
High risk HPV: NEGATIVE
Neisseria Gonorrhea: POSITIVE — AB
Trichomonas: NEGATIVE

## 2019-05-24 NOTE — Assessment & Plan Note (Signed)
Previous testing reviewed and last was negative for HPV/abnormal pathology. Pap/pelvic exam performed without abnormalities.

## 2019-05-24 NOTE — Assessment & Plan Note (Signed)
49yo AA female, Average risk, no FM Hx. Patient prefers to initiate screening at this time which I think is reasonable. Referred to GI for colonoscopy

## 2019-05-25 ENCOUNTER — Other Ambulatory Visit: Payer: Self-pay | Admitting: Student in an Organized Health Care Education/Training Program

## 2019-05-25 MED ORDER — CEFTRIAXONE SODIUM 500 MG IJ SOLR: 500.0000 mg | Freq: Once | INTRAMUSCULAR | Status: DC

## 2019-05-25 NOTE — Progress Notes (Signed)
Patient informed of positive gonorrhea infection. Instructed to notify partner, abstain from unprotected sex until week after treatment, information reported to health department.  Patient unable to come in for treatment until Monday. Put in order for CTX 500mg  IM and requested a nurse visit.

## 2019-05-28 ENCOUNTER — Other Ambulatory Visit: Payer: Self-pay

## 2019-05-28 ENCOUNTER — Telehealth: Payer: Self-pay | Admitting: *Deleted

## 2019-05-28 ENCOUNTER — Ambulatory Visit (INDEPENDENT_AMBULATORY_CARE_PROVIDER_SITE_OTHER): Payer: 59

## 2019-05-28 DIAGNOSIS — A549 Gonococcal infection, unspecified: Secondary | ICD-10-CM

## 2019-05-28 NOTE — Telephone Encounter (Signed)
-----   Message from Leeroy Bock, DO sent at 05/25/2019  2:48 PM EDT ----- Regarding: health department report Hey team, could you please help me by notifying the health department of this patient gonorrhea positive? I have ordered ceftriaxone injection for Monday as that was the earliest the patient can come in.  Thank you Chelsey

## 2019-05-28 NOTE — Telephone Encounter (Signed)
Looks like pt is already scheduled and the nurse will notify health dept. Molly White Molly White, CMA

## 2019-05-29 MED ORDER — CEFTRIAXONE SODIUM 250 MG IJ SOLR
500.0000 mg | Freq: Once | INTRAMUSCULAR | Status: AC
  Administered 2019-05-28: 500 mg via INTRAMUSCULAR

## 2019-05-29 NOTE — Progress Notes (Signed)
Patient in nurse clinic today for STD treatment of gonorrhea.  Patient advised to abstain from sex for 7-10 days after treatment of self and partner.     Ceftriaxone 250 mg x 2 given in RUOQ and LUOQ per Dr. Ewell Poe orders.  Patient observed 15 minutes in office.  No reaction noted.   Patient to follow up in 2-3 months for re-screening.    Provided condoms and advised to use with all sexual activity. Patient verbalized understanding.   STD report form fax completed and faxed to [facility] Department at [phone] (STD department).     Veronda Prude, RN

## 2019-06-06 ENCOUNTER — Other Ambulatory Visit: Payer: Self-pay | Admitting: Family Medicine

## 2019-06-11 ENCOUNTER — Telehealth: Payer: Self-pay | Admitting: *Deleted

## 2019-06-11 NOTE — Telephone Encounter (Signed)
Dr.Danis,  This patient is referred to you for a direct screening colonoscopy. HX of OSA CPAP, HTN, Anxiety, & Graves disease. Her last BMI on 05/22/2019 was 50.92. Okay for a direct hospital colonoscopy or OV first? Please advise. Thank you, Correen Bubolz pv

## 2019-06-13 NOTE — Telephone Encounter (Signed)
She can be directly booked for my next available [facility] outpatient procedure block which is sometime in July.  Please refer to the posted practice schedule for the date.  - HD

## 2019-06-13 NOTE — Telephone Encounter (Signed)
My mistake - I must not have been given a July date.  August is fine.

## 2019-06-13 NOTE — Telephone Encounter (Signed)
There is not a date for July. Pt will have to be scheduled in August.

## 2019-06-14 NOTE — Telephone Encounter (Signed)
Patient added to hospital waitlist for procedure.

## 2019-06-15 NOTE — Telephone Encounter (Signed)
Pt called and made aware that her procedure in June has been canceled along with her 6-1 PV, she is on the wait list for [facility]- she verbalized understanding

## 2019-07-13 ENCOUNTER — Other Ambulatory Visit: Payer: Self-pay

## 2019-07-13 ENCOUNTER — Ambulatory Visit
Admission: RE | Admit: 2019-07-13 | Discharge: 2019-07-13 | Disposition: A | Discharge status: Home / Self Care | Payer: 59 | Source: Ambulatory Visit | Attending: Family Medicine | Admitting: Family Medicine

## 2019-07-13 DIAGNOSIS — Z780 Asymptomatic menopausal state: Secondary | ICD-10-CM

## 2019-07-16 ENCOUNTER — Encounter: Payer: 59 | Admitting: Gastroenterology

## 2019-07-17 ENCOUNTER — Other Ambulatory Visit: Payer: Self-pay

## 2019-07-17 ENCOUNTER — Ambulatory Visit
Admission: RE | Admit: 2019-07-17 | Discharge: 2019-07-17 | Disposition: A | Discharge status: Home / Self Care | Payer: 59 | Source: Ambulatory Visit | Attending: Family Medicine | Admitting: Family Medicine

## 2019-07-24 ENCOUNTER — Other Ambulatory Visit: Payer: Self-pay | Admitting: Student in an Organized Health Care Education/Training Program

## 2019-08-15 ENCOUNTER — Other Ambulatory Visit: Payer: Self-pay | Admitting: Student in an Organized Health Care Education/Training Program

## 2019-08-16 NOTE — Telephone Encounter (Signed)
Refill provided but recommend that patient schedule follow up appointment with PCP to ensure continued improvement. This can be virtual or in-person.

## 2019-08-17 NOTE — Telephone Encounter (Signed)
Virtual appointment made per [facility].  Glennie Hawk, CMA

## 2019-08-20 ENCOUNTER — Other Ambulatory Visit: Payer: Self-pay | Admitting: Student in an Organized Health Care Education/Training Program

## 2019-08-29 ENCOUNTER — Encounter: Payer: Self-pay | Admitting: Student in an Organized Health Care Education/Training Program

## 2019-08-29 ENCOUNTER — Telehealth (INDEPENDENT_AMBULATORY_CARE_PROVIDER_SITE_OTHER): Payer: 59 | Admitting: Student in an Organized Health Care Education/Training Program

## 2019-08-29 ENCOUNTER — Other Ambulatory Visit: Payer: Self-pay

## 2019-08-29 DIAGNOSIS — Z712 Person consulting for explanation of examination or test findings: Secondary | ICD-10-CM | POA: Insufficient documentation

## 2019-08-29 DIAGNOSIS — F419 Anxiety disorder, unspecified: Secondary | ICD-10-CM

## 2019-08-29 MED ORDER — ESCITALOPRAM OXALATE 10 MG PO TABS
10.0000 mg | ORAL_TABLET | Freq: Every day | ORAL | 0 refills | Status: DC
Start: 2019-08-29 — End: 2019-10-17

## 2019-08-29 NOTE — Assessment & Plan Note (Signed)
Symptomatically doing well but is complaining of limb jerking while falling asleep as side effect of medication. Reviewed recent lab reports which were negative for anemia or abnormal thyroid results -Discontinue citalopram and substitute escitalopram for trial to see if her jerking movements improve -Follow-up appointment in 3 to 4 weeks.  If drinking continues repeat thyroid studies/CBC/etc.

## 2019-08-29 NOTE — Assessment & Plan Note (Signed)
Reviewed results with patient and she was relieved to hear that she has normal bone density

## 2019-08-29 NOTE — Progress Notes (Signed)
[facility] Telemedicine Visit  Patient consented to have virtual visit and was identified by name and date of birth. Method of visit: Telephone  Encounter participants: Patient: Molly White - located at home Provider: Leeroy Bock - located at [facility] Others (if applicable): [place]  Chief Complaint: dexa results and medication follow up  HPI:  Citalopram- doing well.  Her anxiety is well controlled.  Having jerking movement of arm or leg or foot when falling asleep. Never happened before taking this medication.  Occurs every night or when taking a nap. Never wakes her up from sleeping.  Does not impact her sleep quality or her daily activities.  DEXA results-patient has not been informed of her x-ray results yet.  ROS: per HPI  Pertinent PMHx: Anxiety, Graves' disease  Exam:  Respiratory: Negative for conversational dyspnea  Assessment/Plan:  Anxiety Symptomatically doing well but is complaining of limb jerking while falling asleep as side effect of medication. Reviewed recent lab reports which were negative for anemia or abnormal thyroid results -Discontinue citalopram and substitute escitalopram for trial to see if her jerking movements improve -Follow-up appointment in 3 to 4 weeks.  If drinking continues repeat thyroid studies/CBC/etc.  Visit for review of DEXA scan Reviewed results with patient and she was relieved to hear that she has normal bone density    Time spent during visit with patient: 11 minutes

## 2019-09-27 ENCOUNTER — Telehealth: Payer: Self-pay | Admitting: Psychology

## 2019-10-16 ENCOUNTER — Other Ambulatory Visit: Payer: Self-pay | Admitting: Student in an Organized Health Care Education/Training Program

## 2019-11-02 ENCOUNTER — Other Ambulatory Visit: Payer: Self-pay

## 2019-11-02 ENCOUNTER — Encounter: Payer: Self-pay | Admitting: Student in an Organized Health Care Education/Training Program

## 2019-11-02 ENCOUNTER — Ambulatory Visit (INDEPENDENT_AMBULATORY_CARE_PROVIDER_SITE_OTHER): Payer: 59 | Admitting: Student in an Organized Health Care Education/Training Program

## 2019-11-02 VITALS — BP 141/85 | HR 87 | Ht 64.0 in | Wt 308.2 lb

## 2019-11-02 DIAGNOSIS — L0291 Cutaneous abscess, unspecified: Secondary | ICD-10-CM

## 2019-11-02 DIAGNOSIS — L02412 Cutaneous abscess of left axilla: Secondary | ICD-10-CM | POA: Diagnosis not present

## 2019-11-02 DIAGNOSIS — R7303 Prediabetes: Secondary | ICD-10-CM | POA: Diagnosis not present

## 2019-11-02 DIAGNOSIS — Z720 Tobacco use: Secondary | ICD-10-CM

## 2019-11-02 DIAGNOSIS — E200 Idiopathic hypoparathyroidism: Secondary | ICD-10-CM

## 2019-11-02 DIAGNOSIS — Z7689 Persons encountering health services in other specified circumstances: Secondary | ICD-10-CM | POA: Diagnosis not present

## 2019-11-02 LAB — POCT GLYCOSYLATED HEMOGLOBIN (HGB A1C): HbA1c, POC (prediabetic range): 6.2 % (ref 5.7–6.4)

## 2019-11-02 MED ORDER — LIDOCAINE-EPINEPHRINE 1 %-1:100000 IJ SOLN: 5.0000 mL | Freq: Once | INTRAMUSCULAR | Status: DC

## 2019-11-02 MED ORDER — SULFAMETHOXAZOLE-TRIMETHOPRIM 800-160 MG PO TABS: 1.0000 | ORAL_TABLET | Freq: Two times a day (BID) | ORAL | 0 refills | Status: AC

## 2019-11-02 NOTE — Progress Notes (Signed)
   SUBJECTIVE:   CHIEF COMPLAINT / HPI: bump in left armpit  Bump under left arm. Occurred 2 times previous in the past month but burst and drained pus/blood. This time has been present for 2 weeks and not draining. Becoming more irritating. Hasn't done anything for it. Not present in any other locations. Has not happened in the past. No fevers or systemic symptoms. Using arm normally. Positive for shaving.  hgb A1c- diabetes screen.  Smoker- 1/2ppd.  OBJECTIVE:   BP (!) 141/85   Pulse 87   Ht 5\' 4"  (1.626 m)   Wt (!) 308 lb 3.2 oz (139.8 kg)   SpO2 99%   BMI 52.90 kg/m   BP recheck by provider 128/80 General: NAD, pleasant, able to participate in exam Cardiac: RRR, normal heart sounds, no murmurs. 2+ radial and PT pulses bilaterally Respiratory: CTAB, normal effort, No wheezes, rales or rhonchi Skin: warm and dry, no rashes noted Neuro: alert and oriented, no focal deficits Psych: Normal affect and mood  ASSESSMENT/PLAN:   Abscess Left axillary abscess. Possibly sourced from shaving armpit - I&D in office today revealed white purulence and no immediate complications. Wound left open to drain with antibacterial bandaging. Induration still felt deep to incision. No surrounding erythema or regional lymphadenopathy. - bactrim x5 days - return/ED precautions provided  Prediabetes Obesity and soft tissue infection with h/o hyperglycemia warranted DM screenig today. Patient in pre-diabetic range.  First line treatment is lifestyle changes.  Recheck A1c in 3-4 months and consider starting metformin  Tobacco abuse Counseled patient on tobacco use, specifically in relation to infection and healing   Diagnosis: abscess - Location: left axillary area Procedure: Incision & drainage Informed consent:  Discussed risks (infection, pain, bleeding, bruising, numbness, and recurrence of the condition) and benefits of the procedure, as well as the alternatives.  Informed consent was  obtained. Anesthesia: 1%lidocaine with epi 48mL local infiltration The area was prepared and draped in a standard fashion. The lesion drained pus. Antibiotic ointment and a sterile pressure dressing were applied. The patient tolerated the procedure well. The patient was instructed on post-op care.   1m, DO [facility]

## 2019-11-02 NOTE — Patient Instructions (Signed)
It was a pleasure to see you today!  To summarize our discussion for this visit:  The bump under your arm looked like an abscess. We drained it in office but there was still some deeper signs of infection. We will start with an antibiotic to continue to treat. Come back to see me if it gets worse or you have a fever.   Continue to keep area clean and dry and take antibiotic.  We are testing for diabetes today  Some additional health maintenance measures we should update are: Health Maintenance Due  Topic Date Due   Hepatitis C Screening  Never done     Call the clinic at [phone] if your symptoms worsen or you have any concerns.   Thank you for allowing me to take part in your care,  Dr. Jamelle Rushing   Incision and Drainage, Care After This sheet gives you information about how to care for yourself after your procedure. Your health care provider may also give you more specific instructions. If you have problems or questions, contact your health care provider. What can I expect after the procedure? After the procedure, it is common to have:  Pain or discomfort around the incision site.  Blood, fluid, or pus (drainage) from the incision.  Redness and firm skin around the incision site. Follow these instructions at home: Medicines  Take over-the-counter and prescription medicines only as told by your health care provider.  If you were prescribed an antibiotic medicine, use or take it as told by your health care provider. Do not stop using the antibiotic even if you start to feel better. Wound care Follow instructions from your health care provider about how to take care of your wound. Make sure you:  Wash your hands with soap and water before and after you change your bandage (dressing). If soap and water are not available, use hand sanitizer.  Change your dressing and packing as told by your health care provider. ? If your dressing is dry or stuck when you try to  remove it, moisten or wet the dressing with saline or water so that it can be removed without harming your skin or tissues. ? If your wound is packed, leave it in place until your health care provider tells you to remove it. To remove the packing, moisten or wet the packing with saline or water so that it can be removed without harming your skin or tissues.  Leave stitches (sutures), skin glue, or adhesive strips in place. These skin closures may need to stay in place for 2 weeks or longer. If adhesive strip edges start to loosen and curl up, you may trim the loose edges. Do not remove adhesive strips completely unless your health care provider tells you to do that. Check your wound every day for signs of infection. Check for:  More redness, swelling, or pain.  More fluid or blood.  Warmth.  Pus or a bad smell. If you were sent home with a drain tube in place, follow instructions from your health care provider about:  How to empty it.  How to care for it at home.  General instructions  Rest the affected area.  Do not take baths, swim, or use a hot tub until your health care provider approves. Ask your health care provider if you may take showers. You may only be allowed to take sponge baths.  Return to your normal activities as told by your health care provider. Ask your health care provider  what activities are safe for you. Your health care provider may put you on activity or lifting restrictions.  The incision will continue to drain. It is normal to have some clear or slightly bloody drainage. The amount of drainage should lessen each day.  Do not apply any creams, ointments, or liquids unless you have been told to by your health care provider.  Keep all follow-up visits as told by your health care provider. This is important. Contact a health care provider if:  Your cyst or abscess returns.  You have a fever or chills.  You have more redness, swelling, or pain around your  incision.  You have more fluid or blood coming from your incision.  Your incision feels warm to the touch.  You have pus or a bad smell coming from your incision.  You have red streaks above or below the incision site. Get help right away if:  You have severe pain or bleeding.  You cannot eat or drink without vomiting.  You have decreased urine output.  You become short of breath.  You have chest pain.  You cough up blood.  The affected area becomes numb or starts to tingle. These symptoms may represent a serious problem that is an emergency. Do not wait to see if the symptoms will go away. Get medical help right away. Call your local emergency services (911 in the [place] Do not drive yourself to the hospital. Summary  After this procedure, it is common to have fluid, blood, or pus coming from the surgery site.  Follow all home care instructions. You will be told how to take care of your incision, how to check for infection, and how to take medicines.  If you were prescribed an antibiotic medicine, take it as told by your health care provider. Do not stop taking the antibiotic even if you start to feel better.  Contact a health care provider if you have increased redness, swelling, or pain around your incision. Get help right away if you have chest pain, you vomit, you cough up blood, or you have shortness of breath.  Keep all follow-up visits as told by your health care provider. This is important. This information is not intended to replace advice given to you by your health care provider. Make sure you discuss any questions you have with your health care provider. Document Revised: 12/12/2017 Document Reviewed: 12/12/2017 Elsevier Patient Education  2020 [employer].

## 2019-11-05 DIAGNOSIS — R7303 Prediabetes: Secondary | ICD-10-CM | POA: Insufficient documentation

## 2019-11-05 DIAGNOSIS — L0291 Cutaneous abscess, unspecified: Secondary | ICD-10-CM | POA: Insufficient documentation

## 2019-11-05 NOTE — Assessment & Plan Note (Signed)
Obesity and soft tissue infection with h/o hyperglycemia warranted DM screenig today. Patient in pre-diabetic range.  First line treatment is lifestyle changes.  Recheck A1c in 3-4 months and consider starting metformin

## 2019-11-05 NOTE — Assessment & Plan Note (Signed)
Left axillary abscess. Possibly sourced from shaving armpit - I&D in office today revealed white purulence and no immediate complications. Wound left open to drain with antibacterial bandaging. Induration still felt deep to incision. No surrounding erythema or regional lymphadenopathy. - bactrim x5 days - return/ED precautions provided

## 2019-11-05 NOTE — Assessment & Plan Note (Signed)
Counseled patient on tobacco use, specifically in relation to infection and healing

## 2019-11-21 ENCOUNTER — Other Ambulatory Visit: Payer: Self-pay | Admitting: Student in an Organized Health Care Education/Training Program

## 2019-12-22 ENCOUNTER — Other Ambulatory Visit: Payer: Self-pay | Admitting: Student in an Organized Health Care Education/Training Program

## 2020-01-30 ENCOUNTER — Other Ambulatory Visit: Payer: Self-pay | Admitting: Student in an Organized Health Care Education/Training Program

## 2020-02-04 ENCOUNTER — Other Ambulatory Visit: Payer: Self-pay | Admitting: Student in an Organized Health Care Education/Training Program

## 2020-02-04 NOTE — Telephone Encounter (Signed)
Patient will need an appointment for HTN including BMP for further medication refills.  Sent 1 month suply HCTZ today

## 2020-02-16 ENCOUNTER — Other Ambulatory Visit: Payer: Self-pay | Admitting: Student in an Organized Health Care Education/Training Program

## 2020-02-25 ENCOUNTER — Other Ambulatory Visit: Payer: Self-pay | Admitting: Student in an Organized Health Care Education/Training Program

## 2020-02-25 NOTE — Telephone Encounter (Signed)
Patient needs appointment for HTN for further refills.

## 2020-03-26 ENCOUNTER — Other Ambulatory Visit: Payer: Self-pay

## 2020-03-26 ENCOUNTER — Ambulatory Visit (INDEPENDENT_AMBULATORY_CARE_PROVIDER_SITE_OTHER): Payer: 59 | Admitting: Student in an Organized Health Care Education/Training Program

## 2020-03-26 ENCOUNTER — Encounter: Payer: Self-pay | Admitting: Student in an Organized Health Care Education/Training Program

## 2020-03-26 VITALS — BP 112/88 | HR 85 | Wt 310.8 lb

## 2020-03-26 DIAGNOSIS — Z5181 Encounter for therapeutic drug level monitoring: Secondary | ICD-10-CM

## 2020-03-26 DIAGNOSIS — Z1159 Encounter for screening for other viral diseases: Secondary | ICD-10-CM

## 2020-03-26 DIAGNOSIS — R7303 Prediabetes: Secondary | ICD-10-CM | POA: Diagnosis not present

## 2020-03-26 DIAGNOSIS — H9193 Unspecified hearing loss, bilateral: Secondary | ICD-10-CM | POA: Diagnosis not present

## 2020-03-26 DIAGNOSIS — L608 Other nail disorders: Secondary | ICD-10-CM

## 2020-03-26 DIAGNOSIS — H65493 Other chronic nonsuppurative otitis media, bilateral: Secondary | ICD-10-CM

## 2020-03-26 LAB — POCT GLYCOSYLATED HEMOGLOBIN (HGB A1C): Hemoglobin A1C: 6.2 % — AB (ref 4.0–5.6)

## 2020-03-26 NOTE — Patient Instructions (Addendum)
It was a pleasure to see you today!  To summarize our discussion for this visit:  You overall look well today.   Please follow up with GI for your colonoscopy and for your mammogram.   We are going to check your kidney/liver function today as well as a hepC screening.   After these results, I can prescribe a treatment for the infection in your toe nails  Your A1c has remained stable today  I'll refill your medications  ENT office will contact you to set up an appointment for your ears   Some additional health maintenance measures we should update are: Health Maintenance Due  Topic Date Due  . Hepatitis C Screening  Never done  . COLONOSCOPY (Pts 45-58yrs Insurance coverage will need to be confirmed)  Never done  . COVID-19 Vaccine (3 - Booster for Pfizer series) 02/08/2020  . MAMMOGRAM  03/08/2020  .    Please return to our clinic to see me in about 6 months.  Call the clinic at [phone] if your symptoms worsen or you have any concerns.   Thank you for allowing me to take part in your care,  Dr. Jamelle Rushing

## 2020-03-26 NOTE — Progress Notes (Signed)
   SUBJECTIVE:   CHIEF COMPLAINT / HPI: ears and toes  Ears- noticed gradually worsening hearing over the last year. No pain. Feels clogged in the morning. No drainage from her ears. Tried peroxide, uses qtips or bobby pins. No ringing in her ears. Denies balance issues. Uses cpap. Has frequent congestion. Uses cetirizine and Flonase daily.  Toes- darkening toe nails. First noticed it last week. Both feet. No injuries. Does not wear socks. No itching, pain, no smell. No numbness. Haven't tried anything for them and hasn;t happened before.   OBJECTIVE:   BP 112/88   Pulse 85   Wt (!) 310 lb 12.8 oz (141 kg)   SpO2 98%   BMI 53.35 kg/m   Physical Exam Vitals reviewed.  Constitutional:      Appearance: She is obese. She is not ill-appearing or toxic-appearing.  HENT:     Head: Normocephalic.     Right Ear: External ear normal. No drainage, swelling or tenderness. A middle ear effusion is present. There is no impacted cerumen.     Left Ear: External ear normal. No drainage, swelling or tenderness. A middle ear effusion is present. There is no impacted cerumen.     Nose: Congestion present.     Right Turbinates: Enlarged.     Left Turbinates: Enlarged.     Mouth/Throat:     Mouth: Mucous membranes are moist.  Feet:     Right foot:     Toenail Condition: Right toenails are abnormally thick. Fungal disease present.    Left foot:     Toenail Condition: Left toenails are abnormally thick. Fungal disease present. Lymphadenopathy:     Cervical: No cervical adenopathy.  Neurological:     Mental Status: She is alert.    ASSESSMENT/PLAN:   Onychomadesis of toenail Discussed treatment options with patient. She chose to proceed with oral therapy.  CMP today to monitor kidney/liver function Prescribing Terbinafine x12 weeks  Chronic middle ear effusion Bilateral clear ear effusions.  Patient already using daily Flonase and Zyrtec without relief. Likely has chronic sinus congestion  caused by anatomical cause. Refer to ENT.    Leeroy Bock, DO [facility]

## 2020-03-27 DIAGNOSIS — L608 Other nail disorders: Secondary | ICD-10-CM | POA: Insufficient documentation

## 2020-03-27 LAB — COMPREHENSIVE METABOLIC PANEL
ALT: 35 IU/L — ABNORMAL HIGH (ref 0–32)
AST: 39 IU/L (ref 0–40)
Albumin/Globulin Ratio: 1.3 (ref 1.2–2.2)
Albumin: 4.1 g/dL (ref 3.8–4.8)
Alkaline Phosphatase: 68 IU/L (ref 44–121)
BUN/Creatinine Ratio: 11 (ref 9–23)
BUN: 9 mg/dL (ref 6–24)
Bilirubin Total: 0.4 mg/dL (ref 0.0–1.2)
CO2: 25 mmol/L (ref 20–29)
Calcium: 10.1 mg/dL (ref 8.7–10.2)
Chloride: 100 mmol/L (ref 96–106)
Creatinine, Ser: 0.81 mg/dL (ref 0.57–1.00)
Globulin, Total: 3.1 g/dL (ref 1.5–4.5)
Glucose: 140 mg/dL — ABNORMAL HIGH (ref 65–99)
Potassium: 3.8 mmol/L (ref 3.5–5.2)
Sodium: 142 mmol/L (ref 134–144)
Total Protein: 7.2 g/dL (ref 6.0–8.5)
eGFR: 88 mL/min/{1.73_m2} (ref >59)

## 2020-03-27 LAB — HEPATITIS C ANTIBODY: Hep C Virus Ab: <0.1 s/co ratio (ref 0.0–0.9)

## 2020-03-27 LAB — CBC
Hematocrit: 46.2 % (ref 34.0–46.6)
Hemoglobin: 15.6 g/dL (ref 11.1–15.9)
MCH: 33 pg (ref 26.6–33.0)
MCHC: 33.8 g/dL (ref 31.5–35.7)
MCV: 98 fL — ABNORMAL HIGH (ref 79–97)
Platelets: 167 10*3/uL (ref 150–450)
RBC: 4.73 x10E6/uL (ref 3.77–5.28)
RDW: 11.9 % (ref 11.7–15.4)
WBC: 5.1 10*3/uL (ref 3.4–10.8)

## 2020-03-27 MED ORDER — FLUTICASONE PROPIONATE 50 MCG/ACT NA SUSP: NASAL | 1 refills | Status: DC

## 2020-03-27 MED ORDER — ESCITALOPRAM OXALATE 10 MG PO TABS: ORAL_TABLET | ORAL | 1 refills | Status: DC

## 2020-03-27 MED ORDER — HYDROCHLOROTHIAZIDE 25 MG PO TABS: ORAL_TABLET | ORAL | 0 refills | Status: DC

## 2020-03-27 MED ORDER — TERBINAFINE HCL 250 MG PO TABS: 250.0000 mg | ORAL_TABLET | Freq: Every day | ORAL | 2 refills | Status: DC

## 2020-03-27 NOTE — Assessment & Plan Note (Signed)
Discussed treatment options with patient. She chose to proceed with oral therapy.  CMP today to monitor kidney/liver function Prescribing Terbinafine x12 weeks

## 2020-03-28 DIAGNOSIS — H65499 Other chronic nonsuppurative otitis media, unspecified ear: Secondary | ICD-10-CM | POA: Insufficient documentation

## 2020-03-28 NOTE — Assessment & Plan Note (Signed)
Bilateral clear ear effusions.  Patient already using daily Flonase and Zyrtec without relief. Likely has chronic sinus congestion caused by anatomical cause. Refer to ENT.

## 2020-05-01 ENCOUNTER — Other Ambulatory Visit: Payer: Self-pay | Admitting: Student in an Organized Health Care Education/Training Program

## 2020-05-01 NOTE — Telephone Encounter (Signed)
Patient needs an appointment for further refills. Would also remind her to send refill requests by contacting pharmacy

## 2020-05-01 NOTE — Telephone Encounter (Signed)
Patient calls nurse line regarding refill on propranolol. Patient states that she only has a few pills left and is going out of town tomorrow. Advised patient of refill policy. Will forward to PCP.   Veronda Prude, RN

## 2020-05-18 ENCOUNTER — Other Ambulatory Visit: Payer: Self-pay | Admitting: Student in an Organized Health Care Education/Training Program

## 2020-05-31 ENCOUNTER — Other Ambulatory Visit: Payer: Self-pay | Admitting: Student in an Organized Health Care Education/Training Program

## 2020-06-19 ENCOUNTER — Other Ambulatory Visit: Payer: Self-pay | Admitting: Student in an Organized Health Care Education/Training Program

## 2020-06-19 NOTE — Telephone Encounter (Signed)
Patient calls nurse line requesting increased quantity on rx refill. Currently rx is being refilled with quantity of 15. Informed patient that she needs an appointment for BP follow up. Scheduled patient for 6/7 at 1010.  Veronda Prude, RN

## 2020-07-01 ENCOUNTER — Ambulatory Visit: Payer: 59 | Admitting: Student in an Organized Health Care Education/Training Program

## 2020-07-02 ENCOUNTER — Ambulatory Visit: Payer: 59 | Admitting: Student in an Organized Health Care Education/Training Program

## 2020-07-03 ENCOUNTER — Other Ambulatory Visit: Payer: Self-pay | Admitting: Student in an Organized Health Care Education/Training Program

## 2020-07-06 ENCOUNTER — Other Ambulatory Visit: Payer: Self-pay | Admitting: Student in an Organized Health Care Education/Training Program

## 2020-07-09 ENCOUNTER — Other Ambulatory Visit: Payer: Self-pay | Admitting: Student in an Organized Health Care Education/Training Program

## 2020-07-09 NOTE — Telephone Encounter (Signed)
Patient calls nurse line regarding rx refill request for propranolol. Patient does not have a cardiologist. Patient states that she could not get off of work for previously scheduled appointment. Patient rescheduled for Monday morning.   Please advise if partial refill can be sent. Advised patient of the importance of coming to this appointment for reevaluation of BP.   Veronda Prude, RN

## 2020-07-14 ENCOUNTER — Ambulatory Visit: Payer: 59 | Admitting: Student in an Organized Health Care Education/Training Program

## 2020-07-15 ENCOUNTER — Ambulatory Visit (INDEPENDENT_AMBULATORY_CARE_PROVIDER_SITE_OTHER): Payer: 59 | Admitting: Student in an Organized Health Care Education/Training Program

## 2020-07-15 ENCOUNTER — Ambulatory Visit (INDEPENDENT_AMBULATORY_CARE_PROVIDER_SITE_OTHER): Payer: 59

## 2020-07-15 ENCOUNTER — Other Ambulatory Visit: Payer: Self-pay

## 2020-07-15 ENCOUNTER — Encounter: Payer: Self-pay | Admitting: Student in an Organized Health Care Education/Training Program

## 2020-07-15 VITALS — BP 142/84 | HR 82 | Ht 64.0 in | Wt 321.0 lb

## 2020-07-15 DIAGNOSIS — Z1231 Encounter for screening mammogram for malignant neoplasm of breast: Secondary | ICD-10-CM

## 2020-07-15 DIAGNOSIS — Z0000 Encounter for general adult medical examination without abnormal findings: Secondary | ICD-10-CM

## 2020-07-15 DIAGNOSIS — Z23 Encounter for immunization: Secondary | ICD-10-CM

## 2020-07-15 DIAGNOSIS — E0500 Thyrotoxicosis with diffuse goiter without thyrotoxic crisis or storm: Secondary | ICD-10-CM

## 2020-07-15 DIAGNOSIS — I10 Essential (primary) hypertension: Secondary | ICD-10-CM

## 2020-07-15 DIAGNOSIS — Z1211 Encounter for screening for malignant neoplasm of colon: Secondary | ICD-10-CM

## 2020-07-15 DIAGNOSIS — R7303 Prediabetes: Secondary | ICD-10-CM

## 2020-07-15 NOTE — Progress Notes (Signed)
   SUBJECTIVE:   CHIEF COMPLAINT / HPI: BP follow up  Covid booster- due today  Graves disease- well controlled, does not endorse hypo or hyper symptoms. Does not follow with endocrinology.  HTN- adherent with treatment. Needs a refill.   Pre-diabetes- drinking a lot of sodas and poor diet.   Health maintenance- mammogram and colonoscopy due  OBJECTIVE:   BP (!) 142/84   Pulse 82   Ht 5\' 4"  (1.626 m)   Wt (!) 321 lb (145.6 kg)   SpO2 97%   BMI 55.10 kg/m   Physical Exam Vitals and nursing note reviewed.  Constitutional:      General: She is not in acute distress.    Appearance: Normal appearance. She is obese. She is not ill-appearing or toxic-appearing.  Eyes:     Conjunctiva/sclera: Conjunctivae normal.  Neck:     Thyroid: No thyroid mass, thyromegaly or thyroid tenderness.     Trachea: Trachea normal.  Cardiovascular:     Rate and Rhythm: Normal rate and regular rhythm.  Pulmonary:     Effort: Pulmonary effort is normal. No respiratory distress.     Breath sounds: Normal breath sounds.  Musculoskeletal:     Cervical back: Neck supple. No tenderness.     Right lower leg: No edema.     Left lower leg: No edema.  Lymphadenopathy:     Cervical: No cervical adenopathy.  Skin:    General: Skin is warm and dry.     Capillary Refill: Capillary refill takes less than 2 seconds.  Neurological:     General: No focal deficit present.     Mental Status: She is alert and oriented to person, place, and time.     Comments: Neg hand tremor  Psychiatric:        Mood and Affect: Mood normal.        Behavior: Behavior normal.     ASSESSMENT/PLAN:   COVID-19 vaccine administered Given today and tolerated well  Graves disease TSH today. Thyroid exam normal.  Denies symptoms.  Adherent with labetalol Refer to endocrinology for management  Prediabetes A1c today. Weight gain and poor diet  HYPERTENSION, BENIGN SYSTEMIC Elevated today at initial but repeat by  provider manually was improved to normal.  Healthcare maintenance Discussed cancer screenings.  Ordered mammogram and GI referral for colonoscopy     , DO [facility]

## 2020-07-15 NOTE — Patient Instructions (Addendum)
It was a pleasure to see you today!  To summarize our discussion for this visit: Today we checked your blood pressure and will also check some blood work. I would also like for you to get your routine cancer screenings for breast and colon cancer. I have sent in orders for both I sent in a referral for endocrinology to follow with your Graves disease. Please cut back on sodas and sugary drinks to help prevent weight gain and high blood sugars that can lead to diabetes. We will check again today to see how your A1c is doing.  Some additional health maintenance measures we should update are: Health Maintenance Due  Topic Date Due   Pneumococcal Vaccine 53-49 Years old (1 - PCV) Never done   COLONOSCOPY (Pts 45-47yrs Insurance coverage will need to be confirmed)  Never done   COVID-19 Vaccine (3 - Booster for Pfizer series) 01/08/2020   MAMMOGRAM  03/08/2020   Zoster Vaccines- Shingrix (1 of 2) Never done     Please return to our clinic to see [place] in about 3 months.  Call the clinic at [phone] if your symptoms worsen or you have any concerns.   Thank you for allowing me to take part in your care,  Dr. Jamelle Rushing

## 2020-07-16 LAB — BASIC METABOLIC PANEL
BUN/Creatinine Ratio: 11 (ref 9–23)
BUN: 10 mg/dL (ref 6–24)
CO2: 27 mmol/L (ref 20–29)
Calcium: 9.6 mg/dL (ref 8.7–10.2)
Chloride: 96 mmol/L (ref 96–106)
Creatinine, Ser: 0.88 mg/dL (ref 0.57–1.00)
Glucose: 243 mg/dL — ABNORMAL HIGH (ref 65–99)
Potassium: 3.6 mmol/L (ref 3.5–5.2)
Sodium: 136 mmol/L (ref 134–144)
eGFR: 80 mL/min/{1.73_m2} (ref >59)

## 2020-07-16 LAB — TSH: TSH: 1.88 u[IU]/mL (ref 0.450–4.500)

## 2020-07-16 LAB — HEMOGLOBIN A1C
Est. average glucose Bld gHb Est-mCnc: 148 mg/dL
Hgb A1c MFr Bld: 6.8 % — ABNORMAL HIGH (ref 4.8–5.6)

## 2020-07-21 DIAGNOSIS — Z23 Encounter for immunization: Secondary | ICD-10-CM | POA: Insufficient documentation

## 2020-07-21 DIAGNOSIS — Z0000 Encounter for general adult medical examination without abnormal findings: Secondary | ICD-10-CM | POA: Insufficient documentation

## 2020-07-21 DIAGNOSIS — Z1211 Encounter for screening for malignant neoplasm of colon: Secondary | ICD-10-CM | POA: Insufficient documentation

## 2020-07-21 NOTE — Assessment & Plan Note (Signed)
A1c today. Weight gain and poor diet

## 2020-07-21 NOTE — Assessment & Plan Note (Signed)
Elevated today at initial but repeat by provider manually was improved to normal.

## 2020-07-21 NOTE — Assessment & Plan Note (Signed)
Discussed cancer screenings.  Ordered mammogram and GI referral for colonoscopy

## 2020-07-21 NOTE — Assessment & Plan Note (Signed)
TSH today. Thyroid exam normal.  Denies symptoms.  Adherent with labetalol Refer to endocrinology for management

## 2020-07-21 NOTE — Assessment & Plan Note (Signed)
Given today and tolerated well

## 2020-07-23 ENCOUNTER — Other Ambulatory Visit: Payer: Self-pay

## 2020-07-23 ENCOUNTER — Ambulatory Visit
Admission: RE | Admit: 2020-07-23 | Discharge: 2020-07-23 | Disposition: A | Discharge status: Home / Self Care | Payer: 59 | Source: Ambulatory Visit | Attending: Family Medicine | Admitting: Family Medicine

## 2020-07-23 ENCOUNTER — Other Ambulatory Visit: Payer: Self-pay | Admitting: Student in an Organized Health Care Education/Training Program

## 2020-07-23 DIAGNOSIS — Z1231 Encounter for screening mammogram for malignant neoplasm of breast: Secondary | ICD-10-CM

## 2020-07-24 ENCOUNTER — Other Ambulatory Visit: Payer: Self-pay

## 2020-07-24 DIAGNOSIS — Z1211 Encounter for screening for malignant neoplasm of colon: Secondary | ICD-10-CM

## 2020-07-24 MED ORDER — NA SULFATE-K SULFATE-MG SULF 17.5-3.13-1.6 GM/177ML PO SOLN: 1.0000 | Freq: Once | ORAL | 0 refills | Status: AC

## 2020-09-26 ENCOUNTER — Other Ambulatory Visit: Payer: Self-pay | Admitting: Student in an Organized Health Care Education/Training Program

## 2020-09-30 ENCOUNTER — Other Ambulatory Visit: Payer: Self-pay | Admitting: Student in an Organized Health Care Education/Training Program

## 2020-10-06 NOTE — Progress Notes (Addendum)
SUBJECTIVE:   Chief compliant/HPI: annual examination  Molly White is a 50 y.o. who presents today for an annual exam.   History tabs reviewed and updated.   Patient states she is smoking a little over half a pack a day. Interested in stopping. Started at age 39.  Notes that her significant other also smokes.  She also mentions that she drinks about 6 shots of tequila every Friday and Saturday.  She has been drinking like this since she was 21.  She does not know any problems with this and feels that she could stop.  She has never had any withdrawal symptoms.  She has had periods of time where she has gone without drinking.  She is in a healthy relationship with her boyfriend.  No concerns for abuse.  No concerns for sexually transmitted infections at this time.  Notes that she has been on the Mirena for over 10 years.  Would like to schedule an appointment to have this removed and reinserted.  She declines flu vaccine today.  OBJECTIVE:   BP (!) 142/90   Pulse 91   Wt (!) 325 lb 3.2 oz (147.5 kg)   SpO2 96%   BMI 55.82 kg/m   General: Smells of tobacco, awake, alert, oriented, in no acute distress, pleasant and cooperative with examination HEENT: Normocephalic, atraumatic, nares patent, dentition is good, oropharynx without erythema or exudates, unable to appreciate right TM, left TM clear.   Cardio: RRR without murmur, 2+ radial, DP and PT pulses b/l Respiratory: Increased expiratory phase, without wheezing or rhonchi Abdomen: Morbidly obese, soft, non-tender to palpation of all quadrants, non-distended, no rebound/guarding MSK: Able to move all extremities spontaneously, good muscle strength, no abnormalities Extremities: without edema or cyanosis Neuro: Speech is clear and intact, no focal deficits, no facial asymmetry, follows commands  Psych: Normal mood and affect  ASSESSMENT/PLAN:   Onychomadesis of toenail States she has been on terbinafine for about 6 months.   This is longer than the intended duration.  Patient has toenails painted, so unable to get a good visualization of her onychomycosis.  She requests referral to podiatry for ingrown toenails as well. -CMP today to evaluate liver; can likely STOP terbinafine -Referral to podiatry placed today  Hearing problem of right ear Patient notes intermittent "clogging" of her right ear.  She would like a referral to ENT.  On exam, unable to visualize the right TM.  Difficult to move her ear to get good visualization.  -Referral to ENT placed  Alcohol use disorder, moderate, dependence (Molly White) Patient was surprised to hear that she was drinking more than the recommended amount.  She does not feel that she has a problem with alcohol and feels that she would be able to cut down.  We discussed potential medications to help with this. She will continue efforts on her own for now. We discussed how alcohol can negatively affect our body and affect weight gain, liver dysfunction, cardiac problems.  Tobacco abuse Patient has a 15-pack-year history.  She is in the precontemplative state when it comes to tobacco cessation.  She was provided with the smoking cessation hotline to call for resources and guidance to help her stop smoking.  IUD (intrauterine device) in place Has been on the Mirena for about 10 years.  We discussed that the IUD is likely not effective anymore.  I encouraged her to schedule an appointment for follow-up for IUD removal and reinsertion.  HYPERTENSION, BENIGN SYSTEMIC Elevated blood pressure reading  of 170/127 on initial BP reading.  Patient is asymptomatic. Repeat measurement improved to 142/90.  She admits to smoking within an hour of her appointment.  She is currently on 80 mg of propranolol and 25 mg of hydrochlorothiazide.  She appears compliant with her medication regimen.  She has a blood pressure cuff at home. -Advised patient to take home BP readings -She will schedule an appointment for  follow-up on her blood pressure -She may benefit from additional medication -We extensively discussed lifestyle interventions including weight loss, dietary changes, alcohol and tobacco cessation  Health Maintenance -Lipid panel today -Flu vaccine offered but patient declined -Colonoscopy is scheduled -She is up-to-date on her Pap smear and mammogram screenings  Annual Examination  See AVS for age appropriate recommendations  PHQ score 2, reviewed and discussed.  BP reviewed and not at goal. She was elevated to 170/127 initially but on repeat after waiting some time she was improved to 142/90.  Asked about intimate partner violence and resources given as appropriate.   Considered the following items based upon USPSTF recommendations: Diabetes screening:  Last ordered 2 months ago- prediabetic with A1c of 6.8. Counseled on diet and exercise recommendations. Screening for elevated cholesterol: ordered HIV testing:  previously ordered and negative Hepatitis C:  previously ordered and negative Hepatitis B: discussed and not ordered Syphilis if at high risk: discussed and not ordered GC/CT was positive for gonorrhea April 2021. She is s/p treatment but has not had a test of cure. Patient would like to wait to do a test of cure at follow up. Osteoporosis screening considered based upon risk of fracture from [facility] calculator. Major osteoporotic fracture risk is about 2.5% (calculator does not allow me to insert her true weight). DEXA not ordered.  Reviewed risk factors for latent tuberculosis and not indicated  Discussed family history, BRCA testing .  Cervical cancer screening: prior Pap reviewed, repeat due in April 2026 Breast cancer screening:  patient is up to date- recently had mammogram 2 months ago that was normal.  Colorectal cancer screening: discussed and patient has a colonoscopy scheduled already. Lung cancer screening: discussed and not indicated   Vaccinations: Flu vaccine  declined.   [name], Molly White

## 2020-10-06 NOTE — Patient Instructions (Addendum)
It was wonderful to see you today.  Please bring ALL of your medications with you to every visit.   Today we talked about:  -We are doing some lab tests today to check your cholesterol and your liver. I will contact you with the results via MyChart.  -Call 1-800-QUIT-NOW to help with smoking cessation.  -Please schedule an appointment at the front for IUD removal and reinsertion. We can also recheck your blood pressure.   Today at your annual preventive visit we talked about the following measures   I recommend 150 minutes of exercise per week-try 30 minutes 5 days per week We discussed reducing sugary beverages (like soda and juice) and increasing leafy greens and whole fruits.  We discussed avoiding tobacco and alcohol.  I recommend avoiding illicit substances.  Your blood pressure is not at goal of <140/90. Please schedule an appointment for a blood pressure follow up. Please do not smoke before the appointment.     Thank you for choosing [facility].   Please call [phone] with any questions about today's appointment.  Please be sure to schedule follow up at the front  desk before you leave today.   Sabino Dick, DO PGY-2 Family Medicine

## 2020-10-07 ENCOUNTER — Other Ambulatory Visit: Payer: Self-pay

## 2020-10-07 ENCOUNTER — Ambulatory Visit (INDEPENDENT_AMBULATORY_CARE_PROVIDER_SITE_OTHER): Payer: 59 | Admitting: Family Medicine

## 2020-10-07 ENCOUNTER — Encounter: Payer: Self-pay | Admitting: Family Medicine

## 2020-10-07 ENCOUNTER — Other Ambulatory Visit: Payer: Self-pay | Admitting: Student in an Organized Health Care Education/Training Program

## 2020-10-07 VITALS — BP 142/90 | HR 91 | Wt 325.2 lb

## 2020-10-07 DIAGNOSIS — Z13220 Encounter for screening for lipoid disorders: Secondary | ICD-10-CM | POA: Diagnosis not present

## 2020-10-07 DIAGNOSIS — Z975 Presence of (intrauterine) contraceptive device: Secondary | ICD-10-CM | POA: Insufficient documentation

## 2020-10-07 DIAGNOSIS — B351 Tinea unguium: Secondary | ICD-10-CM

## 2020-10-07 DIAGNOSIS — Z720 Tobacco use: Secondary | ICD-10-CM

## 2020-10-07 DIAGNOSIS — L600 Ingrowing nail: Secondary | ICD-10-CM

## 2020-10-07 DIAGNOSIS — F1020 Alcohol dependence, uncomplicated: Secondary | ICD-10-CM | POA: Insufficient documentation

## 2020-10-07 DIAGNOSIS — L608 Other nail disorders: Secondary | ICD-10-CM

## 2020-10-07 DIAGNOSIS — H9191 Unspecified hearing loss, right ear: Secondary | ICD-10-CM | POA: Insufficient documentation

## 2020-10-07 DIAGNOSIS — I10 Essential (primary) hypertension: Secondary | ICD-10-CM

## 2020-10-07 NOTE — Assessment & Plan Note (Signed)
States she has been on terbinafine for about 6 months.  This is longer than the intended duration.  Patient has toenails painted, so unable to get a good visualization of her onychomycosis.  She requests referral to podiatry for ingrown toenails as well. -CMP today to evaluate liver; can likely STOP terbinafine -Referral to podiatry placed today

## 2020-10-07 NOTE — Assessment & Plan Note (Signed)
Patient has a 15-pack-year history.  She is in the precontemplative state when it comes to tobacco cessation.  She was provided with the smoking cessation hotline to call for resources and guidance to help her stop smoking.

## 2020-10-07 NOTE — Assessment & Plan Note (Addendum)
Has been on the Mirena for about 10 years.  We discussed that the IUD is likely not effective anymore.  I encouraged her to schedule an appointment for follow-up for IUD removal and reinsertion.

## 2020-10-07 NOTE — Assessment & Plan Note (Signed)
Patient was surprised to hear that she was drinking more than the recommended amount.  She does not feel that she has a problem with alcohol and feels that she would be able to cut down.  We discussed potential medications to help with this. She will continue efforts on her own for now. We discussed how alcohol can negatively affect our body and affect weight gain, liver dysfunction, cardiac problems.

## 2020-10-07 NOTE — Assessment & Plan Note (Signed)
Elevated blood pressure reading of 170/127 on initial BP reading.  Patient is asymptomatic. Repeat measurement improved to 142/90.  She admits to smoking within an hour of her appointment.  She is currently on 80 mg of propranolol and 25 mg of hydrochlorothiazide.  She appears compliant with her medication regimen.  She has a blood pressure cuff at home. -Advised patient to take home BP readings -She will schedule an appointment for follow-up on her blood pressure -She may benefit from additional medication -We extensively discussed lifestyle interventions including weight loss, dietary changes, alcohol and tobacco cessation

## 2020-10-07 NOTE — Assessment & Plan Note (Signed)
Patient notes intermittent "clogging" of her right ear.  She would like a referral to ENT.  On exam, unable to visualize the right TM.  Difficult to move her ear to get good visualization.  -Referral to ENT placed

## 2020-10-08 ENCOUNTER — Other Ambulatory Visit: Payer: Self-pay | Admitting: Family Medicine

## 2020-10-08 DIAGNOSIS — E782 Mixed hyperlipidemia: Secondary | ICD-10-CM

## 2020-10-08 LAB — COMPREHENSIVE METABOLIC PANEL
ALT: 34 IU/L — ABNORMAL HIGH (ref 0–32)
AST: 36 IU/L (ref 0–40)
Albumin/Globulin Ratio: 1.3 (ref 1.2–2.2)
Albumin: 4.2 g/dL (ref 3.8–4.8)
Alkaline Phosphatase: 69 IU/L (ref 44–121)
BUN/Creatinine Ratio: 9 (ref 9–23)
BUN: 7 mg/dL (ref 6–24)
Bilirubin Total: 0.2 mg/dL (ref 0.0–1.2)
CO2: 29 mmol/L (ref 20–29)
Calcium: 9.9 mg/dL (ref 8.7–10.2)
Chloride: 98 mmol/L (ref 96–106)
Creatinine, Ser: 0.79 mg/dL (ref 0.57–1.00)
Globulin, Total: 3.3 g/dL (ref 1.5–4.5)
Glucose: 115 mg/dL — ABNORMAL HIGH (ref 65–99)
Potassium: 4 mmol/L (ref 3.5–5.2)
Sodium: 139 mmol/L (ref 134–144)
Total Protein: 7.5 g/dL (ref 6.0–8.5)
eGFR: 91 mL/min/{1.73_m2} (ref >59)

## 2020-10-08 LAB — LIPID PANEL
Chol/HDL Ratio: 3.7 ratio (ref 0.0–4.4)
Cholesterol, Total: 250 mg/dL — ABNORMAL HIGH (ref 100–199)
HDL: 68 mg/dL (ref >39)
LDL Chol Calc (NIH): 146 mg/dL — ABNORMAL HIGH (ref 0–99)
Triglycerides: 202 mg/dL — ABNORMAL HIGH (ref 0–149)
VLDL Cholesterol Cal: 36 mg/dL (ref 5–40)

## 2020-10-08 MED ORDER — ATORVASTATIN CALCIUM 40 MG PO TABS: 40.0000 mg | ORAL_TABLET | Freq: Every day | ORAL | 3 refills | Status: DC

## 2020-10-08 NOTE — Progress Notes (Signed)
Starting Atorvastatin 40 mg as ASCVD risk is 9.4%. Discussed common adverse effects with patient and she is amenable to starting. Discussed importance of alcohol and smoking cessation, along with diet and exercise to help. Will plan recheck of lipid panel in 6-weeks. Future order placed.

## 2020-10-10 ENCOUNTER — Other Ambulatory Visit: Payer: Self-pay | Admitting: Student in an Organized Health Care Education/Training Program

## 2020-10-15 ENCOUNTER — Other Ambulatory Visit: Payer: Self-pay

## 2020-10-15 ENCOUNTER — Telehealth: Payer: Self-pay | Admitting: *Deleted

## 2020-10-15 ENCOUNTER — Ambulatory Visit (INDEPENDENT_AMBULATORY_CARE_PROVIDER_SITE_OTHER): Payer: 59 | Admitting: Podiatry

## 2020-10-15 DIAGNOSIS — L600 Ingrowing nail: Secondary | ICD-10-CM

## 2020-10-15 MED ORDER — DOXYCYCLINE HYCLATE 100 MG PO TABS: 100.0000 mg | ORAL_TABLET | Freq: Two times a day (BID) | ORAL | 0 refills | Status: DC

## 2020-10-15 MED ORDER — IBUPROFEN 800 MG PO TABS: 800.0000 mg | ORAL_TABLET | Freq: Three times a day (TID) | ORAL | 1 refills | Status: DC

## 2020-10-15 MED ORDER — GENTAMICIN SULFATE 0.1 % EX CREA: 1.0000 "application " | TOPICAL_CREAM | Freq: Two times a day (BID) | CUTANEOUS | 0 refills | Status: DC

## 2020-10-15 NOTE — Telephone Encounter (Signed)
Patient is calling for status of a pain medication(Ibuprofren 800 mg) that was mentioned during visit today. Please send to pharmacy on file, starting to feel pain. Please advise.

## 2020-10-15 NOTE — Telephone Encounter (Signed)
Sent!

## 2020-10-15 NOTE — Addendum Note (Signed)
Addended by: Felecia Shelling on: 10/15/2020 07:50 PM   Modules accepted: Orders

## 2020-10-15 NOTE — Patient Instructions (Signed)

## 2020-10-15 NOTE — Progress Notes (Signed)
   Subjective: Patient presents today for evaluation of pain to the medial lateral border of the bilateral great toes. Patient is concerned for possible ingrown nail.  It is very sensitive to touch.  Patient presents today for further treatment and evaluation.  Past Medical History:  Diagnosis Date   GRAVES DISEASE 03/24/2006   Diagnosed 1996. S/p radio ablation x 2. Patient told that ablation was not completely effective. Current thyroid status unknown as of 07/27/11.     Graves disease 1996   s/p radioablation x 2   Hypertension     Objective:  General: Well developed, nourished, in no acute distress, alert and oriented x3   Dermatology: Skin is warm, dry and supple bilateral.  Medial lateral border bilateral great toes appears to be erythematous with evidence of an ingrowing nail. Pain on palpation noted to the border of the nail fold. The remaining nails appear unremarkable at this time. There are no open sores, lesions.  Vascular: Dorsalis Pedis artery and Posterior Tibial artery pedal pulses palpable. No lower extremity edema noted.   Neruologic: Grossly intact via light touch bilateral.  Musculoskeletal: Muscular strength within normal limits in all groups bilateral. Normal range of motion noted to all pedal and ankle joints.   Assesement: #1 Paronychia with ingrowing nail medial and lateral border bilateral great toes #2 Pain in toe  Plan of Care:  1. Patient evaluated.  2. Discussed treatment alternatives and plan of care. Explained nail avulsion procedure and post procedure course to patient. 3. Patient opted for permanent partial nail avulsion of the ingrown portion of the nail.  4. Prior to procedure, local anesthesia infiltration utilized using 3 ml of a 50:50 mixture of 2% plain lidocaine and 0.5% plain marcaine in a normal hallux block fashion and a betadine prep performed.  5. Partial permanent nail avulsion with chemical matrixectomy performed using 3x30sec applications  of phenol followed by alcohol flush.  6. Light dressing applied.  Post care instructions provided 7.  Prescription for gentamicin 2% cream  8.  Prescription for doxycycline 100 mg 2 times daily #20  9.  Return to clinic 2 weeks.  Felecia Shelling, DPM [facility]  Dr. Felecia Shelling, DPM    [address] [place], [place] [address]                Office [phone]  Fax [phone]

## 2020-10-20 ENCOUNTER — Ambulatory Visit: Payer: 59 | Admitting: Internal Medicine

## 2020-10-20 NOTE — Progress Notes (Deleted)
Name: Molly White  MRN/ DOB: [id number], December 18, 1970    Age/ Sex: 50 y.o., female    PCP: Sabino Dick, DO   Reason for Endocrinology Evaluation: Graves' Disease     Date of Initial Endocrinology Evaluation: 10/20/2020     HPI: [name] is a 50 y.o. female with a past medical history of HTn, Graves' disease. The patient presented for initial endocrinology clinic visit on 10/20/2020 for consultative assistance with her Graves' Disease.   ***  HISTORY:  Past Medical History:  Past Medical History:  Diagnosis Date   GRAVES DISEASE 03/24/2006   Diagnosed 1996. S/p radio ablation x 2. Patient told that ablation was not completely effective. Current thyroid status unknown as of 07/27/11.     Graves disease 1996   s/p radioablation x 2   Hypertension    Past Surgical History: No past surgical history on file.  Social History:  reports that she has been smoking cigarettes. She has a 7.50 pack-year smoking history. She has never used smokeless tobacco. She reports current alcohol use of about 18.0 standard drinks per week. She reports that she does not use drugs. Family History: family history includes Alcohol abuse in her father; Asthma in her mother; COPD in her maternal aunt and maternal uncle; Cancer in her maternal aunt and maternal grandmother; Diabetes in her brother, father, and mother; Heart disease in her brother and sister; Hyperlipidemia in her brother; Hypertension in her brother and mother.   HOME MEDICATIONS: Allergies as of 10/20/2020   No Known Allergies      Medication List        Accurate as of October 20, 2020  9:24 AM. If you have any questions, ask your nurse or doctor.          atorvastatin 40 MG tablet Commonly known as: LIPITOR Take 1 tablet (40 mg total) by mouth daily.   cetirizine 10 MG tablet Commonly known as: ZYRTEC TAKE 1 TABLET(10 MG) BY MOUTH DAILY   doxycycline 100 MG tablet Commonly known as: VIBRA-TABS Take 1 tablet  (100 mg total) by mouth 2 (two) times daily.   escitalopram 10 MG tablet Commonly known as: LEXAPRO TAKE 1 TABLET(10 MG) BY MOUTH DAILY   fluticasone 50 MCG/ACT nasal spray Commonly known as: FLONASE SHAKE LIQUID AND USE 2 SPRAYS IN EACH NOSTRIL DAILY   gentamicin cream 0.1 % Commonly known as: GARAMYCIN Apply 1 application topically 2 (two) times daily.   hydrochlorothiazide 25 MG tablet Commonly known as: HYDRODIURIL TAKE 1 TABLET(25 MG) BY MOUTH DAILY   ibuprofen 800 MG tablet Commonly known as: ADVIL Take 1 tablet (800 mg total) by mouth 3 (three) times daily.   propranolol 80 MG tablet Commonly known as: INDERAL TAKE 1 TABLET(80 MG) BY MOUTH DAILY   terbinafine 250 MG tablet Commonly known as: LAMISIL Take 1 tablet (250 mg total) by mouth daily.   Vitamin D (Ergocalciferol) 1.25 MG (50000 UNIT) Caps capsule Commonly known as: DRISDOL Take 1 capsule (50,000 Units total) by mouth every 7 (seven) days.          REVIEW OF SYSTEMS: A comprehensive ROS was conducted with the patient and is negative except as per HPI and below:  ROS     OBJECTIVE:  VS: There were no vitals taken for this visit.   Wt Readings from Last 3 Encounters:  10/07/20 (!) 325 lb 3.2 oz (147.5 kg)  07/15/20 (!) 321 lb (145.6 kg)  03/26/20 (!) 310 lb 12.8 oz (  141 kg)     EXAM: General: Pt appears well and is in NAD  Eyes: External eye exam normal without stare, lid lag or exophthalmos.  EOM intact.  PERRL.  Neck: General: Supple without adenopathy. Thyroid: Thyroid size normal.  No goiter or nodules appreciated. No thyroid bruit.  Lungs: Clear with good BS bilat with no rales, rhonchi, or wheezes  Heart: Auscultation: RRR.  Abdomen: Normoactive bowel sounds, soft, nontender, without masses or organomegaly palpable  Extremities:  BL LE: No pretibial edema normal ROM and strength.  Skin: Hair: Texture and amount normal with gender appropriate distribution Skin Inspection: No  rashes Skin Palpation: Skin temperature, texture, and thickness normal to palpation  Mental Status: Judgment, insight: Intact Orientation: Oriented to time, place, and person Memory: Intact for recent and remote events Mood and affect: No depression, anxiety, or agitation     DATA REVIEWED: ***    ASSESSMENT/PLAN/RECOMMENDATIONS:   ***    Medications :  Signed electronically by: Lyndle Herrlich, MD  [facility]  [facility] [address] [place] [place], [place] [address] Phone: [phone] FAX: [phone]   CC: Sabino Dick, DO [address] [place] [address] Phone: [phone] Fax: [phone]   Return to Endocrinology clinic as below: Future Appointments  Date Time Provider Department Center  10/20/2020  2:20 PM Ameli Sangiovanni, Konrad Dolores, MD [facility] None  11/03/2020  3:45 PM Felecia Shelling, DPM [facility]  11/04/2020  2:30 PM Sabino Dick, DO [facility]

## 2020-10-27 NOTE — Progress Notes (Deleted)
    OFFICE PROCEDURE NOTE  Molly White is a 50 y.o. No obstetric history on file. here for *** IUD removal and reinsertion. No GYN concerns.  Last pap smear was on 04/2019 and was normal, HPV negative. She was found to have gonorrhea at this time and was treated. Also here to follow up on her HTN. Was previously seen in clinic on 9/13 where she had an initial blood pressure reading of 170/127 that improved 142/90.  She takes 80 mg of propranolol and 25 mg of hydrochlorothiazide daily.  She denies any chest pain, headaches, visual changes, shortness of breath, leg edema. ***  Physical Exam There were no vitals taken for this visit. ***  General: Awake, alert, oriented, in no acute distress, pleasant and cooperative with examination Respiratory: Breathing comfortably on room air, in no respiratory distress Abdomen: Soft, non-tender to palpation of all quadrants, non-distended GU: Normal appearance of labia majora and minora, without lesions. Vagina tissue pink, moist, without lesions or abrasions. Cervix normal appearance, non-friable, without discharge from os. IUD strings visible. Psych: Normal mood and affect   Assessment/Plan   IUD Removal and Reinsertion  Patient identified, informed consent performed, consent signed.   Discussed risks of irregular bleeding, cramping, infection, malpositioning or misplacement of the IUD outside the uterus which may require further procedures. Also discussed >99% contraception efficacy, increased risk of ectopic pregnancy with failure of method.   Emphasized that this did not protect against STIs, condoms recommended during all sexual encounters. Advised to use backup contraception for one week as the risk of pregnancy is higher during the transition period of removing an IUD and replacing it with another one. Time out was performed. Speculum placed in the vagina. The strings of the IUD were grasped and pulled using ring forceps. The IUD was successfully  removed in its entirety. The cervix was cleaned with Betadine x 2 and grasped anteriorly with a single tooth tenaculum.  The new *** IUD insertion apparatus was used to sound the uterus to *** cm;  the IUD was then placed per manufacturer's recommendations. Strings trimmed to 3 cm. Tenaculum was removed, good hemostasis noted. Patient tolerated procedure well.   Patient was given post-procedure instructions.  She was reminded to have backup contraception for one week during this transition period between IUDs.  Patient was also asked to check IUD strings periodically and follow up in 4 weeks for IUD check.   Sabino Dick, DO Family Medicine Resident

## 2020-11-03 ENCOUNTER — Other Ambulatory Visit: Payer: Self-pay

## 2020-11-03 ENCOUNTER — Ambulatory Visit (INDEPENDENT_AMBULATORY_CARE_PROVIDER_SITE_OTHER): Payer: 59 | Admitting: Podiatry

## 2020-11-03 DIAGNOSIS — L600 Ingrowing nail: Secondary | ICD-10-CM

## 2020-11-04 ENCOUNTER — Ambulatory Visit: Payer: 59 | Admitting: Family Medicine

## 2020-11-11 ENCOUNTER — Encounter (HOSPITAL_COMMUNITY): Payer: Self-pay | Admitting: Gastroenterology

## 2020-11-11 NOTE — Progress Notes (Signed)
Attempted to obtain medical history via telephone, unable to reach at this time. I left a voicemail to return pre surgical testing department's phone call.  

## 2020-11-16 NOTE — Progress Notes (Signed)
   Subjective: 50 y.o. female presents today status post permanent nail avulsion procedure of the medial lateral border of the bilateral great toes that was performed on 10/15/2020.  Patient states that she is doing well.  She did have a lot of soreness at the beginning but eventually the pain subsided and she is doing very well.  She presents for further treatment evaluation.   Past Medical History:  Diagnosis Date   GRAVES DISEASE 03/24/2006   Diagnosed 1996. S/p radio ablation x 2. Patient told that ablation was not completely effective. Current thyroid status unknown as of 07/27/11.     Graves disease 1996   s/p radioablation x 2   Hypertension     Objective: Skin is warm, dry and supple. Nail and respective nail fold appears to be healing appropriately. Open wound to the associated nail fold with a granular wound base and moderate amount of fibrotic tissue. Minimal drainage noted. Mild erythema around the periungual region likely due to phenol chemical matricectomy.  Assessment: #1 s/p partial permanent nail matrixectomy medial and lateral border bilateral great toes   Plan of care: #1 patient was evaluated  #2 light debridement of open wound was performed to the periungual border of the respective toe using a currette. Antibiotic ointment and Band-Aid was applied. #3 patient is to return to clinic on a PRN basis.   Felecia Shelling, DPM [facility]  Dr. Felecia Shelling, DPM    [address] [place], [place] [address]                Office [phone]  Fax [phone]

## 2020-11-19 ENCOUNTER — Telehealth: Payer: Self-pay | Admitting: Gastroenterology

## 2020-11-19 NOTE — Telephone Encounter (Signed)
Spoke with patient, she had questions about her prep instructions for tomorrow. Pt misplaced the instructions, advised that she is supposed to be on clear liquids all day today and will begin prep at 6 pm this evening. Pt states that she has access to her my chart, advised that I will send her instructions to her my chart for her review. Pt is aware that she will need to arrive at [facility] by 6 am with a care partner. Pt verbalized understanding and had no concerns at the end of the call.

## 2020-11-19 NOTE — Telephone Encounter (Signed)
Inbound call from patient. Have a procedure scheduled for 10/27 at [facility]. Would like a call back to discuss instructions before procedure.

## 2020-11-20 ENCOUNTER — Ambulatory Visit (HOSPITAL_COMMUNITY): Payer: 59 | Admitting: Anesthesiology

## 2020-11-20 ENCOUNTER — Encounter (HOSPITAL_COMMUNITY): Payer: Self-pay | Admitting: Gastroenterology

## 2020-11-20 ENCOUNTER — Other Ambulatory Visit: Payer: Self-pay

## 2020-11-20 ENCOUNTER — Encounter (HOSPITAL_COMMUNITY)
Admission: RE | Disposition: A | Discharge status: Home / Self Care | Payer: Self-pay | Source: Ambulatory Visit | Attending: Gastroenterology

## 2020-11-20 ENCOUNTER — Ambulatory Visit (HOSPITAL_COMMUNITY)
Admission: RE | Admit: 2020-11-20 | Discharge: 2020-11-20 | Disposition: A | Discharge status: Home / Self Care | Payer: 59 | Source: Ambulatory Visit | Attending: Gastroenterology | Admitting: Gastroenterology

## 2020-11-20 DIAGNOSIS — Z1211 Encounter for screening for malignant neoplasm of colon: Secondary | ICD-10-CM | POA: Diagnosis not present

## 2020-11-20 DIAGNOSIS — F17200 Nicotine dependence, unspecified, uncomplicated: Secondary | ICD-10-CM | POA: Insufficient documentation

## 2020-11-20 DIAGNOSIS — K5730 Diverticulosis of large intestine without perforation or abscess without bleeding: Secondary | ICD-10-CM | POA: Insufficient documentation

## 2020-11-20 HISTORY — PX: COLONOSCOPY WITH PROPOFOL: SHX5780

## 2020-11-20 SURGERY — COLONOSCOPY WITH PROPOFOL
Anesthesia: Monitor Anesthesia Care

## 2020-11-20 MED ORDER — PROPOFOL 10 MG/ML IV BOLUS
INTRAVENOUS | Status: DC | PRN
  Administered 2020-11-20: 50 mg via INTRAVENOUS

## 2020-11-20 MED ORDER — LIDOCAINE 2% (20 MG/ML) 5 ML SYRINGE
INTRAMUSCULAR | Status: DC | PRN
  Administered 2020-11-20: 60 mg via INTRAVENOUS

## 2020-11-20 MED ORDER — PROPOFOL 500 MG/50ML IV EMUL
INTRAVENOUS | Status: AC
  Filled 2020-11-20: qty 50

## 2020-11-20 MED ORDER — PROPOFOL 1000 MG/100ML IV EMUL
INTRAVENOUS | Status: AC
  Filled 2020-11-20: qty 300

## 2020-11-20 MED ORDER — PROPOFOL 500 MG/50ML IV EMUL
INTRAVENOUS | Status: DC | PRN
  Administered 2020-11-20: 125 ug/kg/min via INTRAVENOUS

## 2020-11-20 MED ORDER — PHENYLEPHRINE HCL (PRESSORS) 10 MG/ML IV SOLN
INTRAVENOUS | Status: AC
  Filled 2020-11-20: qty 1

## 2020-11-20 MED ORDER — LACTATED RINGERS IV SOLN: INTRAVENOUS | Status: DC

## 2020-11-20 MED ORDER — PROPOFOL 1000 MG/100ML IV EMUL
INTRAVENOUS | Status: AC
  Filled 2020-11-20: qty 100

## 2020-11-20 MED ORDER — PROPOFOL 500 MG/50ML IV EMUL
INTRAVENOUS | Status: AC
  Filled 2020-11-20: qty 250

## 2020-11-20 MED ORDER — SODIUM CHLORIDE 0.9 % IV SOLN: INTRAVENOUS | Status: DC

## 2020-11-20 SURGICAL SUPPLY — 22 items

## 2020-11-20 NOTE — Anesthesia Procedure Notes (Signed)
Procedure Name: MAC Date/Time: 11/20/2020 7:37 AM Performed by: Cynda Familia, CRNA Pre-anesthesia Checklist: Patient identified, Emergency Drugs available, Suction available, Timeout performed and Patient being monitored Patient Re-evaluated:Patient Re-evaluated prior to induction Oxygen Delivery Method: Simple face mask Placement Confirmation: positive ETCO2 and breath sounds checked- equal and bilateral Dental Injury: Teeth and Oropharynx as per pre-operative assessment

## 2020-11-20 NOTE — H&P (Signed)
History and Physical:  This patient presents for endoscopic testing for: Colon cancer screening  Patient without GI complaints  ROS: Patient denies chest pain or cough   Past Medical History: Past Medical History:  Diagnosis Date   GRAVES DISEASE 03/24/2006   Diagnosed 1996. S/p radio ablation x 2. Patient told that ablation was not completely effective. Current thyroid status unknown as of 07/27/11.     Graves disease 1996   s/p radioablation x 2   Hypertension      Past Surgical History: History reviewed. No pertinent surgical history.  Allergies: No Known Allergies  Outpatient Meds: Current Facility-Administered Medications  Medication Dose Route Frequency Provider Last Rate Last Admin   0.9 %  sodium chloride infusion   Intravenous Continuous Danis, Starr [place] III, MD       lactated ringers infusion   Intravenous Continuous Charlie Pitter III, MD 10 mL/hr at 11/20/20 0711 New Bag at 11/20/20 0711      ___________________________________________________________________ Objective   Exam:  BP (!) 159/93   Pulse 89   Temp 98.5 F (36.9 C) (Oral)   Resp 13   Ht 5\' 4"  (1.626 m)   Wt (!) 145.2 kg   SpO2 98%   BMI 54.93 kg/m   CV: RRR without murmur, S1/S2 Resp: clear to auscultation bilaterally, normal RR and effort noted GI: soft, no tenderness, with active bowel sounds.morbidly obese   Assessment: Average risk CRC   Plan: Colonoscopy  The benefits and risks of the planned procedure were described in detail with the patient or (when appropriate) their health care proxy.  Risks were outlined as including, but not limited to, bleeding, infection, perforation, adverse medication reaction leading to cardiac or pulmonary decompensation, pancreatitis (if ERCP).  The limitation of incomplete mucosal visualization was also discussed.  No guarantees or warranties were given.    The patient is appropriate for an endoscopic procedure in the ambulatory setting.   -  , MD

## 2020-11-20 NOTE — Op Note (Addendum)
[facility] Patient Name: Molly White Procedure Date: 11/20/2020 MRN: [id number] Attending MD: Estill Cotta. Loletha Carrow , MD Date of Birth: Dec 06, 1970 CSN: [id number] Age: 50 Admit Type: Outpatient Procedure:                Colonoscopy Indications:              Screening for colorectal malignant neoplasm, This                            is the patient's first colonoscopy Providers:                Mallie Mussel L. Loletha Carrow, MD, Ervin Knack, RN, Luan Moore, Technician, Glenis Smoker, CRNA Referring MD:             [name] Medicines:                Monitored Anesthesia Care Complications:            No immediate complications. Estimated Blood Loss:     Estimated blood loss: none. Procedure:                Pre-Anesthesia Assessment:                           - Prior to the procedure, a History and Physical                            was performed, and patient medications and                            allergies were reviewed. The patient's tolerance of                            previous anesthesia was also reviewed. The risks                            and benefits of the procedure and the sedation                            options and risks were discussed with the patient.                            All questions were answered, and informed consent                            was obtained. Prior Anticoagulants: The patient has                            taken no previous anticoagulant or antiplatelet                            agents. ASA Grade Assessment: III - A patient with  severe systemic disease. After reviewing the risks                            and benefits, the patient was deemed in                            satisfactory condition to undergo the procedure.                           After obtaining informed consent, the colonoscope                            was passed under direct vision. Throughout the                             procedure, the patient's blood pressure, pulse, and                            oxygen saturations were monitored continuously. The                            CF-HQ190L ([id number]) Olympus colonoscope was                            introduced through the anus and advanced to the the                            cecum, identified by appendiceal orifice and                            ileocecal valve. The colonoscopy was performed                            without difficulty. The patient tolerated the                            procedure fairly well. The quality of the bowel                            preparation was excellent. The ileocecal valve,                            appendiceal orifice, and rectum were photographed. Scope In: 7:48:09 AM Scope Out: 7:58:30 AM Scope Withdrawal Time: 0 hours 8 minutes 2 seconds  Total Procedure Duration: 0 hours 10 minutes 21 seconds  Findings:      The perianal and digital rectal examinations were normal.      Multiple diverticula were found in the left colon and right colon.      The exam was otherwise without abnormality on direct and retroflexion       views. Impression:               - Diverticulosis in the left colon and in the right  colon.                           - The examination was otherwise normal on direct                            and retroflexion views.                           - No specimens collected. Moderate Sedation:      MAC sedation used Recommendation:           - Patient has a contact number available for                            emergencies. The signs and symptoms of potential                            delayed complications were discussed with the                            patient. Return to normal activities tomorrow.                            Written discharge instructions were provided to the                            patient.                           - Resume  previous diet.                           - Continue present medications.                           - Repeat colonoscopy in 10 years for screening                            purposes. Procedure Code(s):        --- Professional ---                           K2706, Colorectal cancer screening; colonoscopy on                            individual not meeting criteria for high risk Diagnosis Code(s):        --- Professional ---                           Z12.11, Encounter for screening for malignant                            neoplasm of colon                           K57.30, Diverticulosis of large intestine without  perforation or abscess without bleeding CPT copyright 2019 American Medical Association. All rights reserved. The codes documented in this report are preliminary and upon coder review may  be revised to meet current compliance requirements. Nahuel Wilbert L. Loletha Carrow, MD 11/20/2020 8:03:49 AM This report has been signed electronically. Number of Addenda: 0

## 2020-11-20 NOTE — Anesthesia Preprocedure Evaluation (Signed)
Anesthesia Evaluation  Patient identified by MRN, date of birth, ID band Patient awake    Reviewed: Allergy & Precautions, NPO status , Patient's Chart, lab work & pertinent test results  Airway Mallampati: II  TM Distance: >3 FB Neck ROM: Full    Dental   Pulmonary sleep apnea , Current Smoker,    breath sounds clear to auscultation       Cardiovascular hypertension, Pt. on medications and Pt. on home beta blockers  Rhythm:Regular Rate:Normal     Neuro/Psych Anxiety negative neurological ROS     GI/Hepatic negative GI ROS, Neg liver ROS,   Endo/Other  negative endocrine ROS  Renal/GU negative Renal ROS     Musculoskeletal   Abdominal   Peds  Hematology negative hematology ROS (+)   Anesthesia Other Findings   Reproductive/Obstetrics                             Anesthesia Physical Anesthesia Plan  ASA: 2  Anesthesia Plan: MAC   Post-op Pain Management:    Induction:   PONV Risk Score and Plan: 1 and Ondansetron and Treatment may vary due to age or medical condition  Airway Management Planned: Natural Airway and Simple Face Mask  Additional Equipment:   Intra-op Plan:   Post-operative Plan:   Informed Consent: I have reviewed the patients History and Physical, chart, labs and discussed the procedure including the risks, benefits and alternatives for the proposed anesthesia with the patient or authorized representative who has indicated his/her understanding and acceptance.       Plan Discussed with:   Anesthesia Plan Comments:         Anesthesia Quick Evaluation

## 2020-11-20 NOTE — Discharge Instructions (Signed)

## 2020-11-20 NOTE — Transfer of Care (Signed)
Immediate Anesthesia Transfer of Care Note  Patient: Molly White  Procedure(s) Performed: COLONOSCOPY WITH PROPOFOL  Patient Location: PACU and Endoscopy Unit  Anesthesia Type:MAC  Level of Consciousness: awake and alert   Airway & Oxygen Therapy: Patient Spontanous Breathing and Patient connected to face mask oxygen  Post-op Assessment: Report given to RN and Post -op Vital signs reviewed and stable  Post vital signs: Reviewed and stable  Last Vitals:  Vitals Value Taken Time  BP    Temp    Pulse    Resp    SpO2      Last Pain:  Vitals:   11/20/20 0650  TempSrc: Oral  PainSc: 0-No pain         Complications: No notable events documented.

## 2020-11-20 NOTE — Interval H&P Note (Signed)
History and Physical Interval Note:  11/20/2020 7:34 AM  Molly White  has presented today for surgery, with the diagnosis of screening colonoscopy.  The various methods of treatment have been discussed with the patient and family. After consideration of risks, benefits and other options for treatment, the patient has consented to  Procedure(s): COLONOSCOPY WITH PROPOFOL (N/A) as a surgical intervention.  The patient's history has been reviewed, patient examined, no change in status, stable for surgery.  I have reviewed the patient's chart and labs.  Questions were answered to the patient's satisfaction.     Charlie Pitter III

## 2020-11-21 ENCOUNTER — Encounter (HOSPITAL_COMMUNITY): Payer: Self-pay | Admitting: Gastroenterology

## 2020-11-21 NOTE — Anesthesia Postprocedure Evaluation (Signed)
Anesthesia Post Note  Patient: Molly White  Procedure(s) Performed: COLONOSCOPY WITH PROPOFOL     Patient location during evaluation: PACU Anesthesia Type: MAC Level of consciousness: awake and alert Pain management: pain level controlled Vital Signs Assessment: post-procedure vital signs reviewed and stable Respiratory status: spontaneous breathing, nonlabored ventilation, respiratory function stable and patient connected to nasal cannula oxygen Cardiovascular status: stable and blood pressure returned to baseline Postop Assessment: no apparent nausea or vomiting Anesthetic complications: no   No notable events documented.  Last Vitals:  Vitals:   11/20/20 0810 11/20/20 0820  BP: (!) 159/81 (!) 152/59  Pulse: 85 82  Resp: (!) 23 19  Temp:  36.5 C  SpO2: 92% 94%    Last Pain:  Vitals:   11/21/20 1316  TempSrc:   PainSc: 0-No pain                 Tiajuana Amass

## 2020-11-26 ENCOUNTER — Other Ambulatory Visit: Payer: Self-pay | Admitting: Family Medicine

## 2020-12-16 ENCOUNTER — Other Ambulatory Visit: Payer: Self-pay | Admitting: Student in an Organized Health Care Education/Training Program

## 2020-12-18 NOTE — Patient Instructions (Signed)
It was wonderful to see you today.  Please bring ALL of your medications with you to every visit.   Today we talked about:  -Continue over the counter medications to help you through this time. You can alternate Tylenol and Ibuprofen every 6 hours as needed for fevers of 100.4 degrees or higher.  -You can take over the counter cough medication. Nasal saline sprays, Vicks vapor rub and humidifiers can also help. - If you develop any shortness of breath, high fevers, excess vomiting preventing you from consuming fluids, or other concerning symptoms please return or go to the emergency department.  -Please reschedule your IUD appointment at your earliest convenience. -I sent a MyChart letter for work. You can return on Thursday but will need to wear a mask for 5 days.   Thank you for choosing [facility].   Please call [phone] with any questions about today's appointment.  Please be sure to schedule follow up at the front  desk before you leave today.   Sabino Dick, DO PGY-2 Family Medicine

## 2020-12-18 NOTE — Progress Notes (Signed)
[facility] Telemedicine Visit  Patient consented to have virtual visit and was identified by name and date of birth. Method of visit: Telephone  Encounter participants: Patient: Molly White - located at home Provider: Sabino Dick - located at [facility]  Chief Complaint: COVID+  HPI: States that she feels fine. She states that her eyes and ears and throat were itchy over the weekend which is what prompted the test. Took a COVID test yesterday that was positive. She has slight congestion. No aches and pains. Taking vitamin C, drinking orange juice, and taking medication for cough/congestion. Has a brown productive cough. Taking Ibuprofen for fevers. Last temperature was 97.4, "it never did get high". No shortness of breath or chest pain. Has received two COVID vaccines and a booster.   ROS: per HPI   Pertinent PMHx: Obesity, tobacco abuse (1/2 ppd)  Exam:  Respiratory: Normal work of breathing, no wheezing, cough noted  Assessment/Plan: No problem-specific Assessment & Plan notes found for this encounter.   Time spent during visit with patient: 10 minutes

## 2020-12-29 ENCOUNTER — Telehealth (INDEPENDENT_AMBULATORY_CARE_PROVIDER_SITE_OTHER): Payer: 59 | Admitting: Family Medicine

## 2020-12-29 DIAGNOSIS — U071 COVID-19: Secondary | ICD-10-CM

## 2020-12-31 ENCOUNTER — Ambulatory Visit: Payer: 59 | Admitting: Internal Medicine

## 2021-01-28 ENCOUNTER — Other Ambulatory Visit: Payer: Self-pay | Admitting: Family Medicine

## 2021-02-03 NOTE — Progress Notes (Deleted)
Name: Molly White  MRN/ DOB: [id number], March 10, 1970    Age/ Sex: 51 y.o., female    PCP: Sabino Dick, DO   Reason for Endocrinology Evaluation: Graves' disease      Date of Initial Endocrinology Evaluation: 02/03/2021     HPI: Molly White is a 51 y.o. female with a past medical history of ***. The patient presented for initial endocrinology clinic visit on 02/03/2021 for consultative assistance with her Graves' Disease .   ***  HISTORY:  Past Medical History:  Past Medical History:  Diagnosis Date   GRAVES DISEASE 03/24/2006   Diagnosed 1996. S/p radio ablation x 2. Patient told that ablation was not completely effective. Current thyroid status unknown as of 07/27/11.     Graves disease 1996   s/p radioablation x 2   Hypertension    Past Surgical History:  Past Surgical History:  Procedure Laterality Date   COLONOSCOPY WITH PROPOFOL N/A 11/20/2020   Procedure: COLONOSCOPY WITH PROPOFOL;  Surgeon: Sherrilyn Rist, MD;  Location: [facility];  Service: Gastroenterology;  Laterality: N/A;    Social History:  reports that she has been smoking cigarettes. She has a 7.50 pack-year smoking history. She has never used smokeless tobacco. She reports current alcohol use of about 18.0 standard drinks per week. She reports that she does not use drugs. Family History: family history includes Alcohol abuse in her father; Asthma in her mother; COPD in her maternal aunt and maternal uncle; Cancer in her maternal aunt and maternal grandmother; Diabetes in her brother, father, and mother; Heart disease in her brother and sister; Hyperlipidemia in her brother; Hypertension in her brother and mother.   HOME MEDICATIONS: Allergies as of 02/04/2021   No Known Allergies      Medication List        Accurate as of February 03, 2021  1:44 PM. If you have any questions, ask your nurse or doctor.          atorvastatin 40 MG tablet Commonly known as: LIPITOR Take 1 tablet (40  mg total) by mouth daily.   cetirizine 10 MG tablet Commonly known as: ZYRTEC TAKE 1 TABLET(10 MG) BY MOUTH DAILY   doxycycline 100 MG tablet Commonly known as: VIBRA-TABS Take 1 tablet (100 mg total) by mouth 2 (two) times daily.   escitalopram 10 MG tablet Commonly known as: LEXAPRO TAKE 1 TABLET(10 MG) BY MOUTH DAILY   fluticasone 50 MCG/ACT nasal spray Commonly known as: FLONASE SHAKE LIQUID AND USE 2 SPRAYS IN EACH NOSTRIL DAILY   gentamicin cream 0.1 % Commonly known as: GARAMYCIN Apply 1 application topically 2 (two) times daily.   hydrochlorothiazide 25 MG tablet Commonly known as: HYDRODIURIL TAKE 1 TABLET(25 MG) BY MOUTH DAILY   ibuprofen 800 MG tablet Commonly known as: ADVIL Take 1 tablet (800 mg total) by mouth 3 (three) times daily. What changed:  when to take this reasons to take this   LUBRICATING EYE DROPS OP Place 1 drop into both eyes at bedtime.   propranolol 80 MG tablet Commonly known as: INDERAL TAKE 1 TABLET(80 MG) BY MOUTH DAILY   terbinafine 250 MG tablet Commonly known as: LAMISIL Take 1 tablet (250 mg total) by mouth daily.          REVIEW OF SYSTEMS: A comprehensive ROS was conducted with the patient and is negative except as per HPI and below:  ROS     OBJECTIVE:  VS: There were no vitals taken for this  visit.   Wt Readings from Last 3 Encounters:  11/20/20 (!) 320 lb (145.2 kg)  10/07/20 (!) 325 lb 3.2 oz (147.5 kg)  07/15/20 (!) 321 lb (145.6 kg)     EXAM: General: Pt appears well and is in NAD  Hydration: Well-hydrated with moist mucous membranes and good skin turgor  Eyes: External eye exam normal without stare, lid lag or exophthalmos.  EOM intact.  PERRL.  Ears, Nose, Throat: Hearing: Grossly intact bilaterally Dental: Good dentition  Throat: Clear without mass, erythema or exudate  Neck: General: Supple without adenopathy. Thyroid: Thyroid size normal.  No goiter or nodules appreciated. No thyroid bruit.   Lungs: Clear with good BS bilat with no rales, rhonchi, or wheezes  Heart: Auscultation: RRR.  Abdomen: Normoactive bowel sounds, soft, nontender, without masses or organomegaly palpable  Extremities: Gait and station: Normal gait  Digits and nails: No clubbing, cyanosis, petechiae, or nodes Head and neck: Normal alignment and mobility BL UE: Normal ROM and strength. BL LE: No pretibial edema normal ROM and strength.  Skin: Hair: Texture and amount normal with gender appropriate distribution Skin Inspection: No rashes, acanthosis nigricans/skin tags. No lipohypertrophy Skin Palpation: Skin temperature, texture, and thickness normal to palpation  Neuro: Cranial nerves: II - XII grossly intact  Cerebellar: Normal coordination and movement; no tremor Motor: Normal strength throughout DTRs: 2+ and symmetric in UE without delay in relaxation phase  Mental Status: Judgment, insight: Intact Orientation: Oriented to time, place, and person Memory: Intact for recent and remote events Mood and affect: No depression, anxiety, or agitation     DATA REVIEWED: ***    ASSESSMENT/PLAN/RECOMMENDATIONS:   ***    Medications :  Signed electronically by: Mack Guise, MD  [facility]  [facility] [address] [place], [place] [address] Phone: [phone] FAX: [phone]   CC: [name], [place] [address] Phone: [phone] Fax: [phone]   Return to Endocrinology clinic as below: Future Appointments  Date Time Provider [place]  02/04/2021  2:40 PM Angelina Neece, Melanie Crazier, MD [id number] None

## 2021-02-04 ENCOUNTER — Ambulatory Visit: Payer: 59 | Admitting: Internal Medicine

## 2021-02-04 ENCOUNTER — Encounter: Payer: Self-pay | Admitting: Internal Medicine

## 2021-02-04 ENCOUNTER — Ambulatory Visit (INDEPENDENT_AMBULATORY_CARE_PROVIDER_SITE_OTHER): Payer: Self-pay | Admitting: Internal Medicine

## 2021-02-04 ENCOUNTER — Other Ambulatory Visit: Payer: Self-pay

## 2021-02-04 VITALS — BP 122/70 | HR 86 | Ht 64.0 in | Wt 327.0 lb

## 2021-02-04 DIAGNOSIS — E0500 Thyrotoxicosis with diffuse goiter without thyrotoxic crisis or storm: Secondary | ICD-10-CM

## 2021-02-04 DIAGNOSIS — R739 Hyperglycemia, unspecified: Secondary | ICD-10-CM

## 2021-02-04 LAB — TSH: TSH: 2.85 u[IU]/mL (ref 0.35–5.50)

## 2021-02-04 LAB — GLUCOSE, POCT (MANUAL RESULT ENTRY): POC Glucose: 132 mg/dl — AB (ref 70–99)

## 2021-02-04 LAB — T4, FREE: Free T4: 0.66 ng/dL (ref 0.60–1.60)

## 2021-02-04 NOTE — Progress Notes (Signed)
Name: Molly White  MRN/ DOB: [id number], 10-29-1970    Age/ Sex: 51 y.o., female    PCP: Sabino Dick, DO   Reason for Endocrinology Evaluation: Graves' disease      Date of Initial Endocrinology Evaluation: 02/04/2021     HPI: [name] is a 51 y.o. female with a past medical history of T2DM . The patient presented for initial endocrinology clinic visit on 02/04/2021 for consultative assistance with her Graves' Disease .     She was diagnosed with Graves' disease in ~1995/1996. S/P RAI ablation twice.  She has not required any LT-4 replacement after her treatment   Her last TSh was normal in 06/2020 at 1.88 uIU/mL    She has been noted with weight gain  She has palpitations and takes propranolol  Denies diarrhea  Denies tremors  Denies local neck symptoms  Does not see an ophthalmologist , has watery eyes   Distant maternal  cousin with thyroid disease    HISTORY:  Past Medical History:  Past Medical History:  Diagnosis Date   GRAVES DISEASE 03/24/2006   Diagnosed 1996. S/p radio ablation x 2. Patient told that ablation was not completely effective. Current thyroid status unknown as of 07/27/11.     Graves disease 1996   s/p radioablation x 2   Hypertension    Past Surgical History:  Past Surgical History:  Procedure Laterality Date   COLONOSCOPY WITH PROPOFOL N/A 11/20/2020   Procedure: COLONOSCOPY WITH PROPOFOL;  Surgeon: Sherrilyn Rist, MD;  Location: [facility];  Service: Gastroenterology;  Laterality: N/A;     Social History:  reports that she has been smoking cigarettes. She has a 7.50 pack-year smoking history. She has never used smokeless tobacco. She reports current alcohol use of about 18.0 standard drinks per week. She reports that she does not use drugs. Family History: family history includes Alcohol abuse in her father; Asthma in her mother; COPD in her maternal aunt and maternal uncle; Cancer in her maternal aunt and maternal  grandmother; Diabetes in her brother, father, and mother; Heart disease in her brother and sister; Hyperlipidemia in her brother; Hypertension in her brother and mother.   HOME MEDICATIONS: Allergies as of 02/04/2021   No Known Allergies      Medication List        Accurate as of February 04, 2021  3:00 PM. If you have any questions, ask your nurse or doctor.          STOP taking these medications    doxycycline 100 MG tablet Commonly known as: VIBRA-TABS Stopped by: Scarlette Shorts, MD   gentamicin cream 0.1 % Commonly known as: GARAMYCIN Stopped by: Scarlette Shorts, MD   terbinafine 250 MG tablet Commonly known as: LAMISIL Stopped by: Scarlette Shorts, MD       TAKE these medications    atorvastatin 40 MG tablet Commonly known as: LIPITOR Take 1 tablet (40 mg total) by mouth daily.   cetirizine 10 MG tablet Commonly known as: ZYRTEC TAKE 1 TABLET(10 MG) BY MOUTH DAILY   escitalopram 10 MG tablet Commonly known as: LEXAPRO TAKE 1 TABLET(10 MG) BY MOUTH DAILY   fluticasone 50 MCG/ACT nasal spray Commonly known as: FLONASE SHAKE LIQUID AND USE 2 SPRAYS IN EACH NOSTRIL DAILY   hydrochlorothiazide 25 MG tablet Commonly known as: HYDRODIURIL TAKE 1 TABLET(25 MG) BY MOUTH DAILY   ibuprofen 800 MG tablet Commonly known as: ADVIL Take 1 tablet (800 mg total)  by mouth 3 (three) times daily. What changed:  when to take this reasons to take this   LUBRICATING EYE DROPS OP Place 1 drop into both eyes at bedtime.   propranolol 80 MG tablet Commonly known as: INDERAL TAKE 1 TABLET(80 MG) BY MOUTH DAILY          REVIEW OF SYSTEMS: A comprehensive ROS was conducted with the patient and is negative except as per HPI     OBJECTIVE:  VS: BP 122/70 (BP Location: Left Arm, Patient Position: Sitting, Cuff Size: Large)    Pulse 86    Ht 5\' 4"  (1.626 m)    Wt (!) 327 lb (148.3 kg)    SpO2 97%    BMI 56.13 kg/m    Wt Readings from Last 3  Encounters:  02/04/21 (!) 327 lb (148.3 kg)  11/20/20 (!) 320 lb (145.2 kg)  10/07/20 (!) 325 lb 3.2 oz (147.5 kg)     EXAM: General: Pt appears well and is in NAD  Hydration: Well-hydrated with moist mucous membranes and good skin turgor  Eyes: External eye exam normal without stare, lid lag or exophthalmos.  EOM intact.  PERRL.  Neck: General: Supple without adenopathy. Thyroid: Thyroid size normal.  No goiter or nodules appreciated.   Lungs: Clear with good BS bilat with no rales, rhonchi, or wheezes  Heart: Auscultation: RRR.  Abdomen: Normoactive bowel sounds, soft, nontender, without masses or organomegaly palpable  Extremities:  BL LE: No pretibial edema normal ROM and strength.  Mental Status: Judgment, insight: Intact Orientation: Oriented to time, place, and person Mood and affect: No depression, anxiety, or agitation     DATA REVIEWED:     Latest Reference Range & Units 02/04/21 15:22  TSH 0.35 - 5.50 uIU/mL 2.85  T4,Free(Direct) 0.60 - 1.60 ng/dL 04/04/21   In office BG 4.49 mg/DL ASSESSMENT/PLAN/RECOMMENDATIONS:   Graves' Disease:   - Resolved  - She is S/P RAI ablation twice in the 1990's, this has been successful and the patient has not required any LT-4 replacement -The patient does not need any endocrinology follow-up as her thyroid functions are normal -I did discuss the risk of Graves' orbitopathy.  I have encouraged the patient to have annual eye exams and to let her ophthalmologist know about previous diagnosis of Graves' disease -TR AB pending  2. T2DM, optimally controlled  -This is managed by her PCP   Follow-up as needed   Signed electronically by: 04-27-1983, MD  [facility]  [facility] [address] [place] [place], [place] Phone: [phone] FAX: [phone]   CC: [phone], DO [address] [place] Phone: [phone] Fax: [phone]   Return to  Endocrinology clinic as below: No future appointments.

## 2021-02-07 LAB — TRAB (TSH RECEPTOR BINDING ANTIBODY): TRAB: <1 IU/L (ref <=2.00)

## 2021-02-15 ENCOUNTER — Other Ambulatory Visit: Payer: Self-pay | Admitting: Family Medicine

## 2021-02-16 ENCOUNTER — Other Ambulatory Visit: Payer: Self-pay | Admitting: Family Medicine

## 2021-03-14 ENCOUNTER — Other Ambulatory Visit: Payer: Self-pay | Admitting: Family Medicine

## 2021-05-04 ENCOUNTER — Other Ambulatory Visit: Payer: Self-pay | Admitting: Family Medicine

## 2021-05-07 NOTE — Progress Notes (Deleted)
? ? ?  SUBJECTIVE:  ? ?CHIEF COMPLAINT / HPI:  ? ?Molly White is a 51 y.o. female with PMHx noted below who presents to the clinic today to discuss the issues below:  ? ?Sleep Difficulties  ? ? ?Abdominal Pain ? ? ? ?PERTINENT  PMH / PSH: HTN, HLD, Graves dz, obesity, anxiety  ? ?OBJECTIVE:  ? ?There were no vitals taken for this visit. *** ? ?General: NAD, pleasant, able to participate in exam ?Cardiac: RRR, no murmurs. ?Respiratory: CTAB, normal effort, No wheezes, rales or rhonchi ?Abdomen: Bowel sounds present, nontender, nondistended, no hepatosplenomegaly. ?Extremities: no edema or cyanosis. ?Skin: warm and dry, no rashes noted ?Neuro: alert, no obvious focal deficits ?Psych: Normal affect and mood ? ?ASSESSMENT/PLAN:  ? ?No problem-specific Assessment & Plan notes found for this encounter. ?  ? ? ?[name], DO ?[place]  ? ?

## 2021-05-11 ENCOUNTER — Ambulatory Visit: Payer: Self-pay | Admitting: Family Medicine

## 2021-05-25 ENCOUNTER — Other Ambulatory Visit: Payer: Self-pay | Admitting: Family Medicine

## 2021-05-26 ENCOUNTER — Ambulatory Visit (INDEPENDENT_AMBULATORY_CARE_PROVIDER_SITE_OTHER): Payer: Self-pay | Admitting: Family Medicine

## 2021-05-26 DIAGNOSIS — L02419 Cutaneous abscess of limb, unspecified: Secondary | ICD-10-CM | POA: Insufficient documentation

## 2021-05-26 MED ORDER — CEPHALEXIN 250 MG PO CAPS: 250.0000 mg | ORAL_CAPSULE | Freq: Four times a day (QID) | ORAL | 0 refills | Status: AC

## 2021-05-26 NOTE — Progress Notes (Signed)
? ? ?  SUBJECTIVE:  ? ?CHIEF COMPLAINT / HPI:  ? ?Axillary abscess ?Patient presents for evaluation of abscess in right axilla.  Reports that it has been bothering her for almost a week and that it will not go away.  Reports that she did not recently shave and has not had any injury to that area.  Has had abscesses in the past that required drainage.  Denies any systemic symptoms such as fever. ? ?OBJECTIVE:  ? ?BP (!) 142/96   Pulse 77   Ht 5\' 4"  (1.626 m)   Wt (!) 327 lb 6.4 oz (148.5 kg)   SpO2 96%   BMI 56.20 kg/m?   ?General: Pleasant 51 year old female in no acute distress ?Cardiac: Regular rate ?Respiratory: Breathing, speaking in full sentences ?Derm: Patient with fluctuant erythematous painful lesion in right axilla (see image below) ? ? ? ?INCISION AND DRAINAGE OF abscess in right axilla ?A timeout protocol was performed prior to initiating the procedure. The area was prepared and draped in the usual, sterile manner. The site was anesthetized with 3 ml of 1% lidocaine with epinephrine. A linear stab incision along the local skin lines was made and purulent drainage was expressed. The abcess was probed with hemostat and sequestered pockets were opened. Bleeding was minimal.  Iodoform gauze packing was placed.  ? ?The patient tolerated the procedure well without complications.  ?Standard post-procedure care is explained and return precautions are given.  ?ASSESSMENT/PLAN:  ? ?Axillary abscess ?Patient with right axillary abscess.  Incision and drainage procedure performed, see procedure note above.  Patient tolerated the procedure well.  Discussed post incision and drainage wound care.  Sent prescription for patient for Keflex 250 mg 4 times daily for 5 days.  Strict ED and return precautions given. ?  ? ? ?Gifford Shave, MD ?[place]  ? ?

## 2021-05-26 NOTE — Assessment & Plan Note (Signed)
Patient with right axillary abscess.  Incision and drainage procedure performed, see procedure note above.  Patient tolerated the procedure well.  Discussed post incision and drainage wound care.  Sent prescription for patient for Keflex 250 mg 4 times daily for 5 days.  Strict ED and return precautions given. ?

## 2021-05-26 NOTE — Patient Instructions (Signed)
It was wonderful seeing you today.  I am sorry you are having issues with these abscesses.  We drained today.  Please remove the packing in 2 days.  I also sent a prescription for a medication called Keflex to your pharmacy which she will take 4 times daily for 5 days.  You may have some pain after the lidocaine wears off so please take Tylenol or Motrin for this.  If you have worsening pain please be reevaluated.  I hope you have a wonderful day! ? ? ?

## 2021-05-27 ENCOUNTER — Ambulatory Visit: Payer: Self-pay | Admitting: Family Medicine

## 2021-06-30 ENCOUNTER — Encounter: Payer: Self-pay | Admitting: *Deleted

## 2021-07-22 ENCOUNTER — Other Ambulatory Visit: Payer: Self-pay | Admitting: Family Medicine

## 2021-07-23 ENCOUNTER — Other Ambulatory Visit: Payer: Self-pay | Admitting: Family Medicine

## 2021-08-06 ENCOUNTER — Encounter (HOSPITAL_COMMUNITY): Payer: Self-pay

## 2021-08-06 ENCOUNTER — Other Ambulatory Visit: Payer: Self-pay

## 2021-08-06 ENCOUNTER — Emergency Department (HOSPITAL_COMMUNITY)
Admission: EM | Admit: 2021-08-06 | Discharge: 2021-08-06 | Disposition: A | Discharge status: Home / Self Care | Payer: 59 | Attending: Emergency Medicine | Admitting: Emergency Medicine

## 2021-08-06 DIAGNOSIS — H40052 Ocular hypertension, left eye: Secondary | ICD-10-CM | POA: Diagnosis not present

## 2021-08-06 DIAGNOSIS — S0592XA Unspecified injury of left eye and orbit, initial encounter: Secondary | ICD-10-CM | POA: Insufficient documentation

## 2021-08-06 DIAGNOSIS — Z23 Encounter for immunization: Secondary | ICD-10-CM | POA: Insufficient documentation

## 2021-08-06 MED ORDER — TETANUS-DIPHTH-ACELL PERTUSSIS 5-2.5-18.5 LF-MCG/0.5 IM SUSY
0.5000 mL | PREFILLED_SYRINGE | Freq: Once | INTRAMUSCULAR | Status: AC
  Administered 2021-08-06: 0.5 mL via INTRAMUSCULAR
  Filled 2021-08-06: qty 0.5

## 2021-08-06 MED ORDER — ERYTHROMYCIN 5 MG/GM OP OINT: TOPICAL_OINTMENT | OPHTHALMIC | 0 refills | Status: DC

## 2021-08-06 MED ORDER — FLUORESCEIN SODIUM 1 MG OP STRP
1.0000 | ORAL_STRIP | Freq: Once | OPHTHALMIC | Status: AC
  Administered 2021-08-06: 1 via OPHTHALMIC
  Filled 2021-08-06: qty 1

## 2021-08-06 MED ORDER — TIMOLOL MALEATE 0.5 % OP SOLN
1.0000 [drp] | Freq: Two times a day (BID) | OPHTHALMIC | Status: DC
  Administered 2021-08-06: 1 [drp] via OPHTHALMIC
  Filled 2021-08-06: qty 5

## 2021-08-06 MED ORDER — TETRACAINE HCL 0.5 % OP SOLN
1.0000 [drp] | Freq: Once | OPHTHALMIC | Status: AC
  Administered 2021-08-06: 1 [drp] via OPHTHALMIC
  Filled 2021-08-06: qty 4

## 2021-08-06 MED ORDER — ERYTHROMYCIN 5 MG/GM OP OINT
TOPICAL_OINTMENT | Freq: Once | OPHTHALMIC | Status: AC
  Filled 2021-08-06: qty 3.5
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
## 2021-08-06 NOTE — ED Notes (Addendum)
Pharmacy called due to missing doses. Medications will be walked down to the ED.

## 2021-08-06 NOTE — Discharge Instructions (Addendum)
Call Dr. Raeanne Gathers office in the morning-- they will give you appt time. Continue using drops and ointment as directed. Return here for new concerns.
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
## 2021-10-29 ENCOUNTER — Other Ambulatory Visit: Payer: Self-pay | Admitting: Family Medicine

## 2021-10-29 DIAGNOSIS — E782 Mixed hyperlipidemia: Secondary | ICD-10-CM

## 2021-11-06 NOTE — Telephone Encounter (Signed)
Opened in error

## 2021-11-17 ENCOUNTER — Other Ambulatory Visit: Payer: Self-pay | Admitting: Family Medicine

## 2021-11-18 ENCOUNTER — Other Ambulatory Visit: Payer: Self-pay | Admitting: Family Medicine

## 2021-11-18 NOTE — Telephone Encounter (Signed)
Patient called stating that she needs refills on her Zyrtec and hydrochlorothiazide, she is completely out of both

## 2021-11-24 ENCOUNTER — Other Ambulatory Visit: Payer: Self-pay

## 2021-11-24 MED ORDER — HYDROCHLOROTHIAZIDE 25 MG PO TABS: ORAL_TABLET | ORAL | 0 refills | Status: DC

## 2021-11-24 NOTE — Telephone Encounter (Signed)
Patient calls nurse line requesting a refill on HCTZ.   Patient has an apt on 11/6 with PCP.   Patient is asking for 1 week worth.   Will forward to PCP.

## 2021-11-30 ENCOUNTER — Ambulatory Visit: Payer: Self-pay | Admitting: Family Medicine

## 2021-12-04 ENCOUNTER — Other Ambulatory Visit: Payer: Self-pay

## 2021-12-04 NOTE — Telephone Encounter (Signed)
Patient calls nurse line requesting another supply of HCTZ.   Patient reports she was supposed to come in on 11/6, however had to reschedule to 11/13. Patient reports she is out of the 7 day supply originally written.   Will forward to PCP.

## 2021-12-07 ENCOUNTER — Ambulatory Visit (INDEPENDENT_AMBULATORY_CARE_PROVIDER_SITE_OTHER): Payer: Commercial Managed Care - HMO | Admitting: Family Medicine

## 2021-12-07 ENCOUNTER — Encounter: Payer: Self-pay | Admitting: Family Medicine

## 2021-12-07 VITALS — BP 128/74 | Ht 64.0 in | Wt 328.0 lb

## 2021-12-07 DIAGNOSIS — R7309 Other abnormal glucose: Secondary | ICD-10-CM

## 2021-12-07 DIAGNOSIS — G4733 Obstructive sleep apnea (adult) (pediatric): Secondary | ICD-10-CM

## 2021-12-07 DIAGNOSIS — E1165 Type 2 diabetes mellitus with hyperglycemia: Secondary | ICD-10-CM | POA: Diagnosis not present

## 2021-12-07 DIAGNOSIS — I10 Essential (primary) hypertension: Secondary | ICD-10-CM | POA: Diagnosis not present

## 2021-12-07 LAB — POCT URINALYSIS DIP (MANUAL ENTRY)
Bilirubin, UA: NEGATIVE
Glucose, UA: NEGATIVE mg/dL
Ketones, POC UA: NEGATIVE mg/dL
Leukocytes, UA: NEGATIVE
Nitrite, UA: NEGATIVE
Protein Ur, POC: NEGATIVE mg/dL
Spec Grav, UA: <=1.005 — AB (ref 1.010–1.025)
Urobilinogen, UA: 0.2 E.U./dL
pH, UA: 6 (ref 5.0–8.0)

## 2021-12-07 LAB — POCT GLYCOSYLATED HEMOGLOBIN (HGB A1C): HbA1c, POC (controlled diabetic range): 8 % — AB (ref 0.0–7.0)

## 2021-12-07 MED ORDER — METFORMIN HCL ER 500 MG PO TB24: 500.0000 mg | ORAL_TABLET | Freq: Every day | ORAL | 3 refills | Status: DC

## 2021-12-07 MED ORDER — HYDROCHLOROTHIAZIDE 25 MG PO TABS: ORAL_TABLET | ORAL | 3 refills | Status: DC

## 2021-12-07 NOTE — Assessment & Plan Note (Addendum)
Previously prediabetic, now diabetic with a hemoglobin A1c of 8. Patient was hesitant to start on medications initially but ultimately it amenable to starting metformin.  She also complained of numbness/tingling of her feet and I am concerned that she may be starting to develop peripheral neuropathy. This does not seem to be that bothersome to her right now.  We agreed to follow-up in 1 month to see how she is doing.  If still bothersome can consider starting low-dose gabapentin at night.  We discussed complications of diabetes if not well controlled. -Follow-up in 1 month to see how she is doing on metformin.  I have given her instructions to uptitrate to 500 mg twice daily if tolerating 500 mg daily well for 1 week -Referral to ophthalmology for diabetic eye exam -Urine microalbumin today -At next appointment consider switching HCTZ to ARB for renal protection -Consider addition of Jardiance for renal protection -Continue statin  -Referral to Nutrition for diabetes education

## 2021-12-07 NOTE — Patient Instructions (Addendum)
It was wonderful to see you today.  Please bring ALL of your medications with you to every visit.   Today we talked about:  -Your A1c was 8 today. This is in the diabetes range. -I am starting you on a medication called Metformin. Take one tablet once a day. If you are doing well, you can increase to two tablets once a day (or take one tablet twice a day).  -Follow up with me in 1 month to check in. We can consider starting a medication for your numbness/tingling if it is still bothersome or worsening.  -I have sent referrals to an ophthalmologist for a diabetic eye exam and a nutritionist. They should call you for an appointment. If you don't hear from their office in 2 weeks, please let us know.  Diet Recommendations for Diabetes  Carbohydrate includes starch, sugar, and fiber.  Of these, only sugar and starch raise blood glucose.  (Fiber is found in fruits, vegetables [especially skin, seeds, and stalks], whole grains, and beans.)   Starchy (carb) foods: Bread, rice, pasta, potatoes, corn, cereal, grits, crackers, bagels, muffins, all baked goods.  (Fruit, milk, and yogurt also have carbohydrate, but most of these foods will not spike your blood sugar as most starchy or sweet foods will.)  A few fruits do cause high blood sugars; use small portions of bananas (limit to 1/2 at a time), grapes, watermelon, and oranges.   Protein foods: Meat, fish, poultry, eggs, dairy foods, and beans such as pinto and kidney beans (beans also provide carbohydrate).   1. Eat at least 3 REAL meals and 1-3 snacks per day.  Eat breakfast within one hour of getting up.  Have something to eat at least every 5 hours while awake.  - A REAL meal for lunch or dinner includes at least some protein, some starch, and vegetables and/or fruit.   - A REAL breakfast needs to include both starch and protein foods.     2. Limit starchy foods to TWO portions per meal and ONE per snack. ONE portion of a starchy food is equal to the  following:  - ONE slice of bread (or its equivalent, such as half of a hamburger bun).  - 1/2 cup of a "scoopable" starchy food such as potatoes or rice.  - 15 grams of Total Carbohydrate as shown on food label.  - 4 ounces of a sweet drink (including fruit juice).  3. Include twice the volume of vegetables as protein or carbohydrate foods in at least 3 meals per week.  - Fresh or frozen vegetables are best.  - Keep frozen vegetables on hand for a quick option.         Thank you for coming to your visit as scheduled. We have had a large "no-show" problem lately, and this significantly limits our ability to see and care for patients. As a friendly reminder- if you cannot make your appointment please call to cancel. We do have a no show policy for those who do not cancel within 24 hours. Our policy is that if you miss or fail to cancel an appointment within 24 hours, 3 times in a 74-month period, you may be dismissed from our clinic.   Thank you for choosing [facility].   Please call [phone] with any questions about today's appointment.  Please be sure to schedule follow up at the front  desk before you leave today.   Sabino Dick, DO PGY-3 Family Medicine

## 2021-12-07 NOTE — Assessment & Plan Note (Addendum)
At goal today. Currently on HCTZ 25 mg. Doing well with this. Will discuss switching to ARB at f/u given her diabetes. -Refilled HCTZ -BMP -UACR today

## 2021-12-07 NOTE — Assessment & Plan Note (Signed)
Her last sleep study was 10 years ago.  She has been compliant with her CPAP but feels it has not been as effective lately. I suspect her settings may need to be changed.  -Sleep study ordered

## 2021-12-07 NOTE — Progress Notes (Signed)
    SUBJECTIVE:   CHIEF COMPLAINT / HPI:   Molly White is a 51 y.o. female who presents to the [facility] clinic today to discuss the following concerns:   Numbness and Tingling Occurring to both feet. Has some numbness of her left 2nd, 3rd, and 4th digits. She has tingling to the soles of both her feet. Ongoing for the last 2 weeks. Not worsening but she is concerned since her dad was a diabetic. She doesn't note any change in urinary frequency. She does feel thirsty often which is new.   Sleep Concerns  She currently uses a CPAP, has had it for about 5-6 years. For the last few months she feels like she is not getting the same quality of sleep she was getting previously. She is compliant with her CPAP. She doesn't feel as well rested.   PERTINENT  PMH / PSH: Hypertension, OSA, Graves' disease, obesity  OBJECTIVE:   BP 128/74   Ht 5\' 4"  (1.626 m)   Wt (!) 328 lb (148.8 kg)   BMI 56.30 kg/m    General: NAD, pleasant, able to participate in exam Respiratory: Normal effort  Foot exam: Dry callused feet bilaterally. Sensation intact to monofilament and light touch.  PT and DP pulses intact BL.   Extremities: no edema  Psych: Normal affect and mood  ASSESSMENT/PLAN:   Type 2 diabetes mellitus with hyperglycemia (HCC) Previously prediabetic, now diabetic with a hemoglobin A1c of 8. Patient was hesitant to start on medications initially but ultimately it amenable to starting metformin.  She also complained of numbness/tingling of her feet and I am concerned that she may be starting to develop peripheral neuropathy. This does not seem to be that bothersome to her right now.  We agreed to follow-up in 1 month to see how she is doing.  If still bothersome can consider starting low-dose gabapentin at night.  We discussed complications of diabetes if not well controlled. -Follow-up in 1 month to see how she is doing on metformin.  I have given her instructions to uptitrate to 500 mg twice daily if  tolerating 500 mg daily well for 1 week -Referral to ophthalmology for diabetic eye exam -Urine microalbumin today -At next appointment consider switching HCTZ to ARB for renal protection -Consider addition of Jardiance for renal protection -Continue statin  -Referral to Nutrition for diabetes education  HYPERTENSION, BENIGN SYSTEMIC At goal today. Currently on HCTZ 25 mg. Doing well with this. Will discuss switching to ARB at f/u given her diabetes. -Refilled HCTZ -BMP -UACR today   Obstructive sleep apnea Her last sleep study was 10 years ago.  She has been compliant with her CPAP but feels it has not been as effective lately. I suspect her settings may need to be changed.  -Sleep study ordered   , DO [facility]

## 2021-12-08 LAB — BASIC METABOLIC PANEL
BUN/Creatinine Ratio: 10 (ref 9–23)
BUN: 7 mg/dL (ref 6–24)
CO2: 23 mmol/L (ref 20–29)
Calcium: 9.6 mg/dL (ref 8.7–10.2)
Chloride: 99 mmol/L (ref 96–106)
Creatinine, Ser: 0.68 mg/dL (ref 0.57–1.00)
Glucose: 192 mg/dL — ABNORMAL HIGH (ref 70–99)
Potassium: 3.9 mmol/L (ref 3.5–5.2)
Sodium: 136 mmol/L (ref 134–144)
eGFR: 105 mL/min/{1.73_m2} (ref >59)

## 2021-12-08 LAB — MICROALBUMIN / CREATININE URINE RATIO
Creatinine, Urine: 56.4 mg/dL
Microalb/Creat Ratio: <5 mg/g creat (ref 0–29)
Microalbumin, Urine: <3 ug/mL

## 2021-12-24 ENCOUNTER — Telehealth: Payer: Self-pay | Admitting: *Deleted

## 2021-12-24 DIAGNOSIS — G4733 Obstructive sleep apnea (adult) (pediatric): Secondary | ICD-10-CM

## 2021-12-24 NOTE — Telephone Encounter (Signed)
Ordered home sleep study with titration

## 2021-12-24 NOTE — Telephone Encounter (Signed)
Received call from Terri with [facility] and patient's insurance denied the in-lab study.  She states that patient can get the home study performed and it doesn't need a PA.  Will forward to MD to determine next steps. If ordering the home sleep test, please order titration as well.    Romesha Scherer,CMA

## 2021-12-30 ENCOUNTER — Other Ambulatory Visit: Payer: Self-pay | Admitting: Family Medicine

## 2022-01-05 ENCOUNTER — Ambulatory Visit: Payer: Commercial Managed Care - HMO

## 2022-01-07 ENCOUNTER — Ambulatory Visit: Payer: Self-pay | Admitting: Family Medicine

## 2022-01-11 ENCOUNTER — Ambulatory Visit: Payer: Commercial Managed Care - HMO | Admitting: Family Medicine

## 2022-01-11 NOTE — Progress Notes (Deleted)
    SUBJECTIVE:   CHIEF COMPLAINT / HPI:   Molly White is a 51 y.o. female who presents to the [facility] clinic today to discuss the following concerns:   Diabetes, Type 2 - Last A1c *** - Medications: *** - Compliance: *** - Checking BG at home: *** - Diet: *** - Exercise: *** - Eye exam: *** - Foot exam: *** - Microalbumin: *** - Statin: *** - Denies symptoms of hypoglycemia, polyuria, polydipsia, numbness extremities, foot ulcers/trauma   PERTINENT  PMH / PSH: ***  OBJECTIVE:   There were no vitals taken for this visit.   General: NAD, pleasant, able to participate in exam Cardiac: RRR, no murmurs. Respiratory: CTAB, normal effort, No wheezes, rales or rhonchi Abdomen: Bowel sounds present, nontender, nondistended, no hepatosplenomegaly. Extremities: no edema or cyanosis. Skin: warm and dry, no rashes noted Neuro: alert, no obvious focal deficits Psych: Normal affect and mood  ASSESSMENT/PLAN:   No problem-specific Assessment & Plan notes found for this encounter.     Sabino Dick, DO [facility]

## 2022-01-11 NOTE — Patient Instructions (Incomplete)
It was wonderful to see you today.  Please bring ALL of your medications with you to every visit.   Today we talked about:  **  Thank you for coming to your visit as scheduled. We have had a large "no-show" problem lately, and this significantly limits our ability to see and care for patients. As a friendly reminder- if you cannot make your appointment please call to cancel. We do have a no show policy for those who do not cancel within 24 hours. Our policy is that if you miss or fail to cancel an appointment within 24 hours, 3 times in a 6-month period, you may be dismissed from our clinic.   Thank you for choosing [facility].   Please call [phone] with any questions about today's appointment.  Please be sure to schedule follow up at the front  desk before you leave today.   Salote Weidmann, DO PGY-3 Family Medicine   

## 2022-01-12 ENCOUNTER — Ambulatory Visit: Payer: Commercial Managed Care - HMO

## 2022-01-15 ENCOUNTER — Other Ambulatory Visit: Payer: Self-pay | Admitting: Family Medicine

## 2022-01-29 ENCOUNTER — Ambulatory Visit (HOSPITAL_BASED_OUTPATIENT_CLINIC_OR_DEPARTMENT_OTHER): Payer: Commercial Managed Care - HMO | Attending: Family Medicine | Admitting: Internal Medicine

## 2022-01-29 DIAGNOSIS — G4733 Obstructive sleep apnea (adult) (pediatric): Secondary | ICD-10-CM

## 2022-03-15 ENCOUNTER — Ambulatory Visit: Payer: Commercial Managed Care - HMO | Admitting: Dietician

## 2022-03-25 ENCOUNTER — Other Ambulatory Visit: Payer: Self-pay | Admitting: Family Medicine

## 2022-04-29 NOTE — Progress Notes (Signed)
    SUBJECTIVE:   CHIEF COMPLAINT / HPI:   Molly White is a 52 y.o. female who presents to the [facility] clinic today to discuss the following concerns:   Diabetes, Type 2 - Last A1c 8 in November  - Medications: Metformin 500 mg daily. States that with 1000 mg she was having diarrhea.  - Compliance: Good - Diet: Eating vegetables, salads. Watching carb intake.  - Exercise: Walks - Eye exam: Due, states has an appt on May 6th - Foot exam: Due - Microalbumin: UTD - Statin: Atorvastatin 40 mg. Due for lipid panel today  She has been working to lose weight. Down 7 lbs since November.   PERTINENT  PMH / PSH: HTN, T2DM, Graves disease, tobacco use disorder  OBJECTIVE:   BP 122/87   Pulse 79   Ht 5\' 4"  (1.626 m)   Wt (!) 321 lb (145.6 kg)   SpO2 98%   BMI 55.10 kg/m    General: NAD, pleasant, able to participate in exam Cardiac: RRR, no murmurs. Respiratory: CTAB, normal effort, No wheezes, rales or rhonchi Abdomen: Obese abdomen Foot exam: No deformities, ulcerations, or other skin breakdown on feet bilaterally. Toenails slightly thickened. Sensation intact to monofilament and light touch.  2+ PT pulses, 1+DP pulses BL.   Psych: Normal affect and mood  ASSESSMENT/PLAN:   1. Type 2 diabetes mellitus with hyperglycemia, unspecified whether long term insulin use A1c improved today to 7.2 from 8 in November. Congratulated patient as she has been working on lifestyle improvement.  She reports that she has to pay out-of-pocket for her medications, given this, there are few medication options.  Decided to start her on glipizide in addition to her metformin since she is only able to tolerate small dose of metformin. Will switch her anti-hypertensive for renal protection. Eye exam next month.  - HgB A1c, next check in 3 months - Lipid Panel - glipiZIDE (GLUCOTROL) 5 MG tablet; Take 1 tablet (5 mg total) by mouth daily before breakfast.  Dispense: 30 tablet; Refill: 2 - olmesartan  (BENICAR) 20 MG tablet; Take 1 tablet (20 mg total) by mouth at bedtime.  Dispense: 30 tablet; Refill: 2 - Foot exam today.  2. Graves disease No hx of active Graves disease on last check. Will check TSH given >1 year since last check  - TSH - Continue Propranolol as started by her Endocrinologist; may be able to come off of this in the future  3. Seasonal allergic rhinitis due to pollen Requesting refill of Flonase.  - fluticasone (FLONASE) 50 MCG/ACT nasal spray; SHAKE LIQUID AND USE 2 SPRAYS IN EACH NOSTRIL DAILY  Dispense: 48 g; Refill: 2  4. HYPERTENSION, BENIGN SYSTEMIC At goal today.  Previously well-controlled on HCTZ.  Will switch to ARB for renal protection. - olmesartan (BENICAR) 20 MG tablet; Take 1 tablet (20 mg total) by mouth at bedtime.  Dispense: 30 tablet; Refill: 2 -Follow-up in 2 weeks for BP recheck, BMP   Sabino Dick, DO [place] [facility]

## 2022-04-30 ENCOUNTER — Encounter: Payer: Self-pay | Admitting: Family Medicine

## 2022-04-30 ENCOUNTER — Ambulatory Visit (INDEPENDENT_AMBULATORY_CARE_PROVIDER_SITE_OTHER): Payer: PRIVATE HEALTH INSURANCE | Admitting: Family Medicine

## 2022-04-30 VITALS — BP 122/87 | HR 79 | Ht 64.0 in | Wt 321.0 lb

## 2022-04-30 DIAGNOSIS — E1165 Type 2 diabetes mellitus with hyperglycemia: Secondary | ICD-10-CM | POA: Diagnosis not present

## 2022-04-30 DIAGNOSIS — J301 Allergic rhinitis due to pollen: Secondary | ICD-10-CM

## 2022-04-30 DIAGNOSIS — E0500 Thyrotoxicosis with diffuse goiter without thyrotoxic crisis or storm: Secondary | ICD-10-CM | POA: Diagnosis not present

## 2022-04-30 DIAGNOSIS — I10 Essential (primary) hypertension: Secondary | ICD-10-CM | POA: Diagnosis not present

## 2022-04-30 LAB — POCT GLYCOSYLATED HEMOGLOBIN (HGB A1C): HbA1c, POC (controlled diabetic range): 7.2 % — AB (ref 0.0–7.0)

## 2022-04-30 MED ORDER — OLMESARTAN MEDOXOMIL 20 MG PO TABS: 20.0000 mg | ORAL_TABLET | Freq: Every day | ORAL | 2 refills | Status: DC

## 2022-04-30 MED ORDER — FLUTICASONE PROPIONATE 50 MCG/ACT NA SUSP: NASAL | 2 refills | Status: DC

## 2022-04-30 MED ORDER — GLIPIZIDE 5 MG PO TABS: 5.0000 mg | ORAL_TABLET | Freq: Every day | ORAL | 2 refills | Status: DC

## 2022-04-30 NOTE — Patient Instructions (Addendum)
It was wonderful to see you today.  Please bring ALL of your medications with you to every visit.   Today we talked about:  STOP your hydrochlorothiazide. We are switching you to another medication for your blood pressure called Olmesartan. Take it once a day. Return on 4/17 for a recheck and blood work.  Your A1c is heading in the right direction. Start the Glipizide as well for your diabetes.  Your next diabetes visit will be in 3 months.  We are checking your cholesterol and thyroid today.  Thank you for coming to your visit as scheduled. We have had a large "no-show" problem lately, and this significantly limits our ability to see and care for patients. As a friendly reminder- if you cannot make your appointment please call to cancel. We do have a no show policy for those who do not cancel within 24 hours. Our policy is that if you miss or fail to cancel an appointment within 24 hours, 3 times in a 69-month period, you may be dismissed from our clinic.   Thank you for choosing [facility].   Please call [phone] with any questions about today's appointment.  Please be sure to schedule follow up at the front  desk before you leave today.   Sabino Dick, DO PGY-3 Family Medicine

## 2022-05-01 LAB — LIPID PANEL
Chol/HDL Ratio: 2.4 ratio (ref 0.0–4.4)
Cholesterol, Total: 154 mg/dL (ref 100–199)
HDL: 65 mg/dL (ref >39)
LDL Chol Calc (NIH): 63 mg/dL (ref 0–99)
Triglycerides: 154 mg/dL — ABNORMAL HIGH (ref 0–149)
VLDL Cholesterol Cal: 26 mg/dL (ref 5–40)

## 2022-05-01 LAB — TSH: TSH: 1.51 u[IU]/mL (ref 0.450–4.500)

## 2022-05-11 NOTE — Progress Notes (Deleted)
    SUBJECTIVE:   CHIEF COMPLAINT / HPI:   Blood Pressure F/U Molly White is a 52 y.o. female who presents to the clinic today for follow up on blood pressure. Was last seen on 4/5 and she was switched from HCTZ 25 mg to Olmesartan 20 mg for renal protection given her T2DM. Reports good*** compliance. Home blood pressure readings range *** systolic and *** diastolic. Denies chest pain, shortness of breath, lower extremity edema, headaches, and vision changes.     PERTINENT  PMH / PSH:  Past Medical History:  Diagnosis Date   GRAVES DISEASE 03/24/2006   Diagnosed 1996. S/p radio ablation x 2. Patient told that ablation was not completely effective. Current thyroid status unknown as of 07/27/11.     Graves disease 1996   s/p radioablation x 2   Hypertension    OBJECTIVE:   There were no vitals taken for this visit.   General: NAD, pleasant, able to participate in exam Cardiac: RRR, no murmurs. Respiratory: CTAB, normal effort, No wheezes, rales or rhonchi Extremities: no edema or cyanosis. Psych: Normal affect and mood  ASSESSMENT/PLAN:   No problem-specific Assessment & Plan notes found for this encounter.      Sabino Dick, DO [place] [facility]

## 2022-05-12 ENCOUNTER — Ambulatory Visit: Payer: Self-pay | Admitting: Family Medicine

## 2022-05-12 DIAGNOSIS — I10 Essential (primary) hypertension: Secondary | ICD-10-CM

## 2022-06-01 ENCOUNTER — Encounter: Payer: Self-pay | Admitting: Family Medicine

## 2022-06-01 LAB — HM DIABETES EYE EXAM

## 2022-06-23 ENCOUNTER — Other Ambulatory Visit: Payer: Self-pay | Admitting: Family Medicine

## 2022-08-18 IMAGING — MG DIGITAL SCREENING BILAT W/ CAD
8 series · 8 of 8 positions shown · non-contrast
Comparison: Previous exam(s).

CLINICAL DATA: Screening.

EXAM:
DIGITAL SCREENING BILATERAL MAMMOGRAM WITH CAD
TECHNIQUE: Bilateral screening digital craniocaudal and mediolateral oblique
mammograms were obtained. The images were evaluated with
computer-aided detection.

[L MLO (1 of 2)]
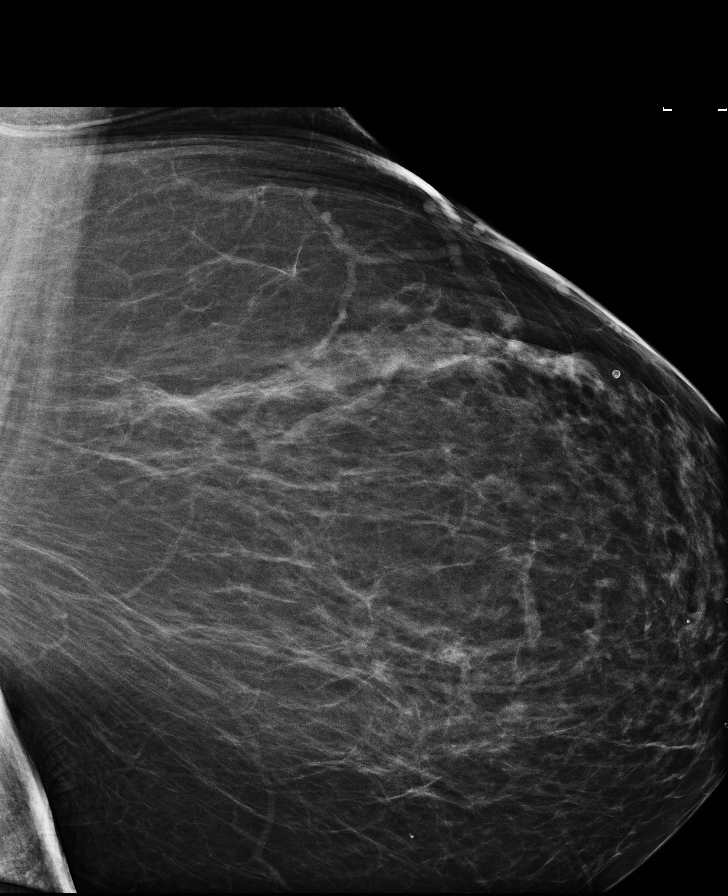

[L CC (1 of 2)]
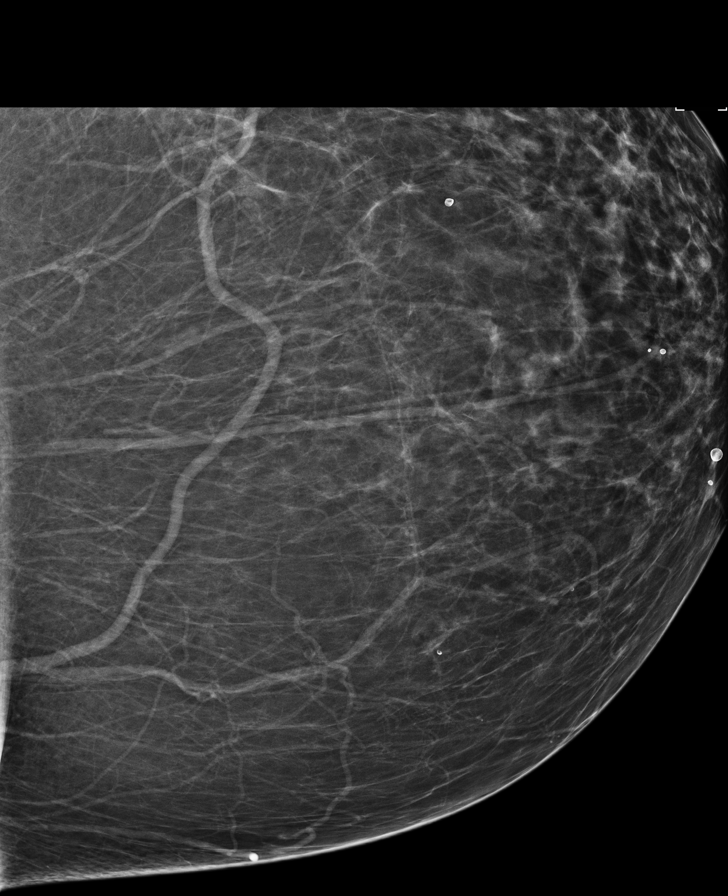

[L MLO (2 of 2)]
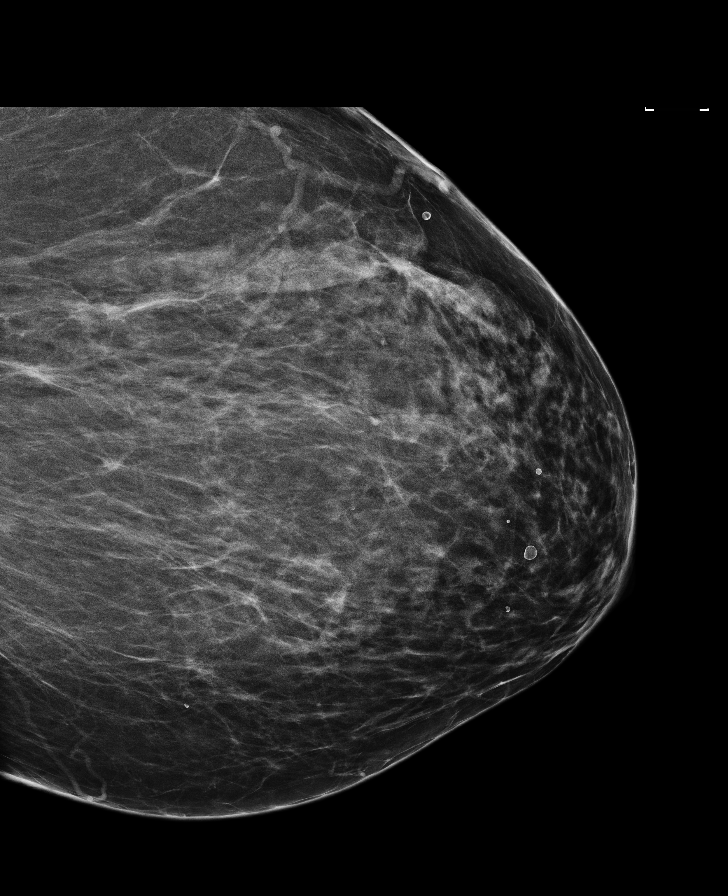

[R MLO (1 of 2)]
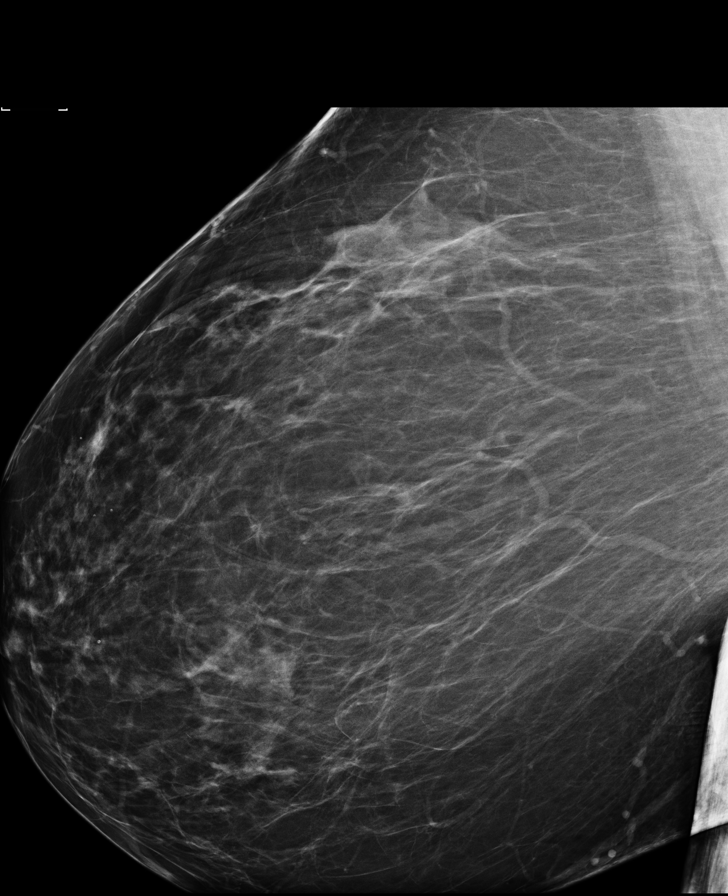

[L CC (2 of 2)]
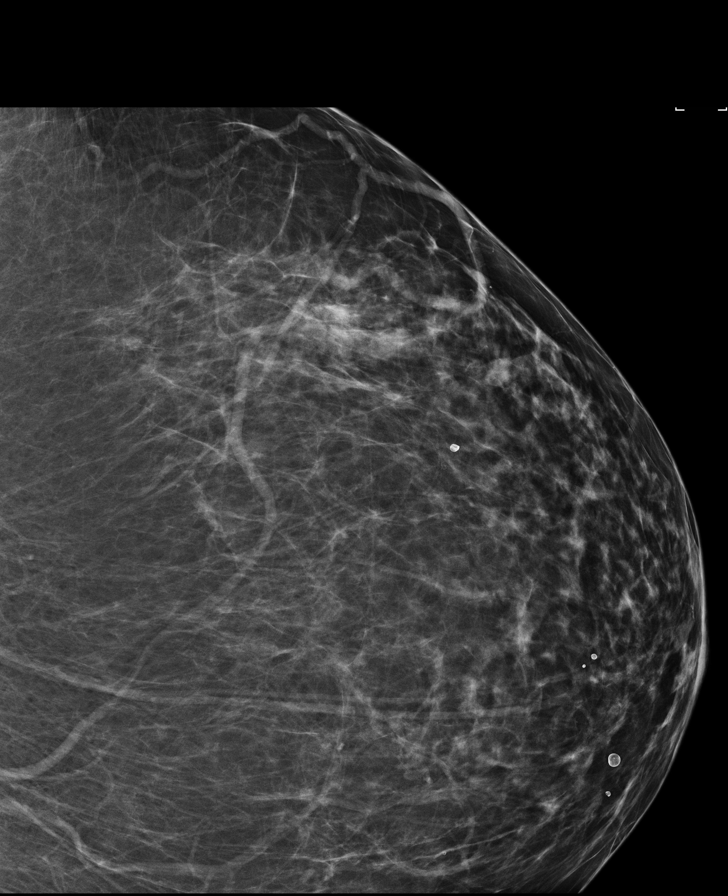

[R CC (1 of 2)]
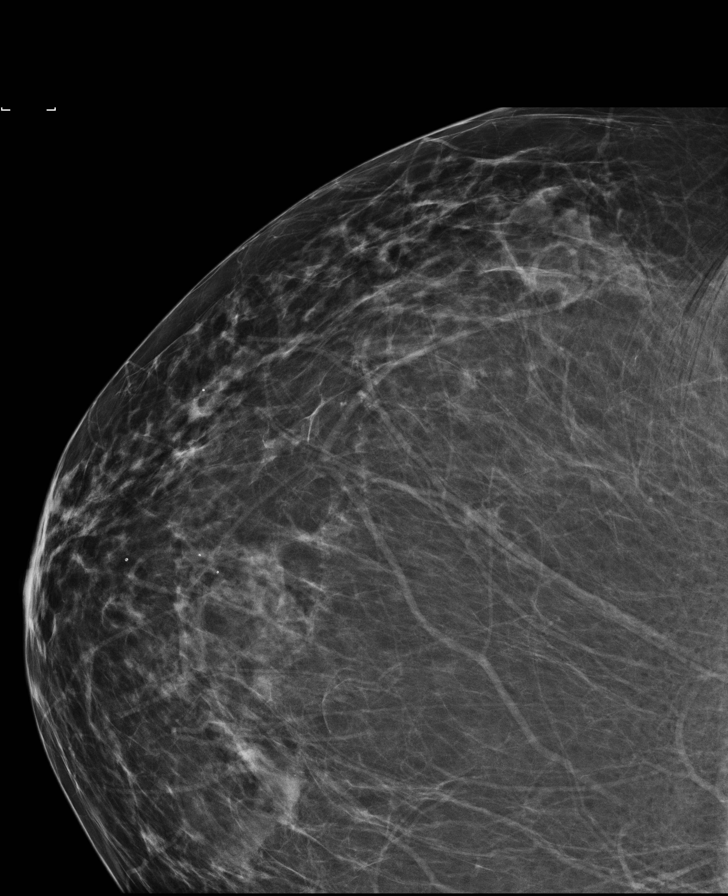

[R CC (2 of 2)]
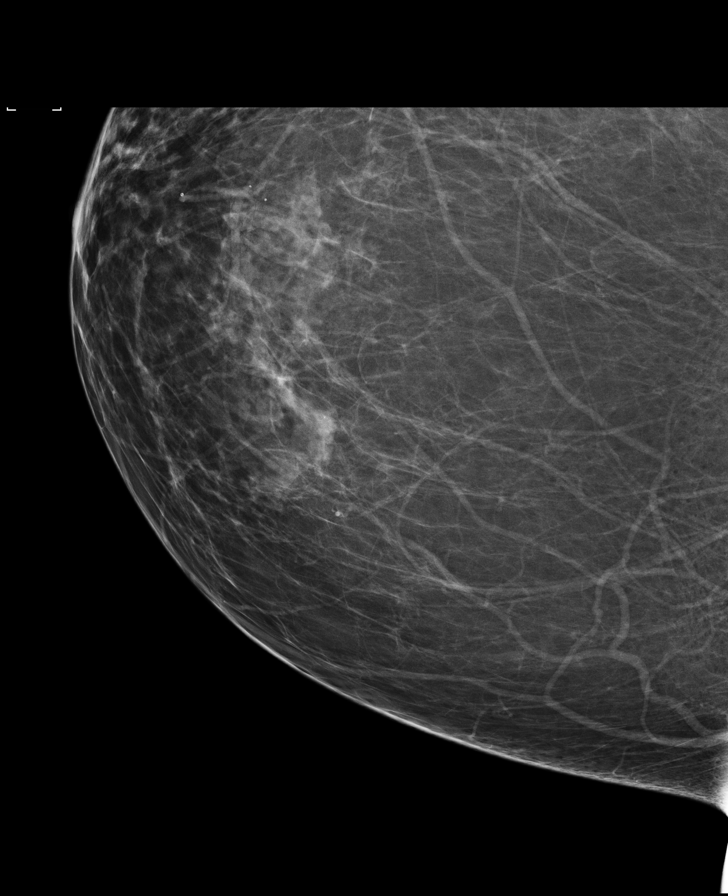

[R MLO (2 of 2)]
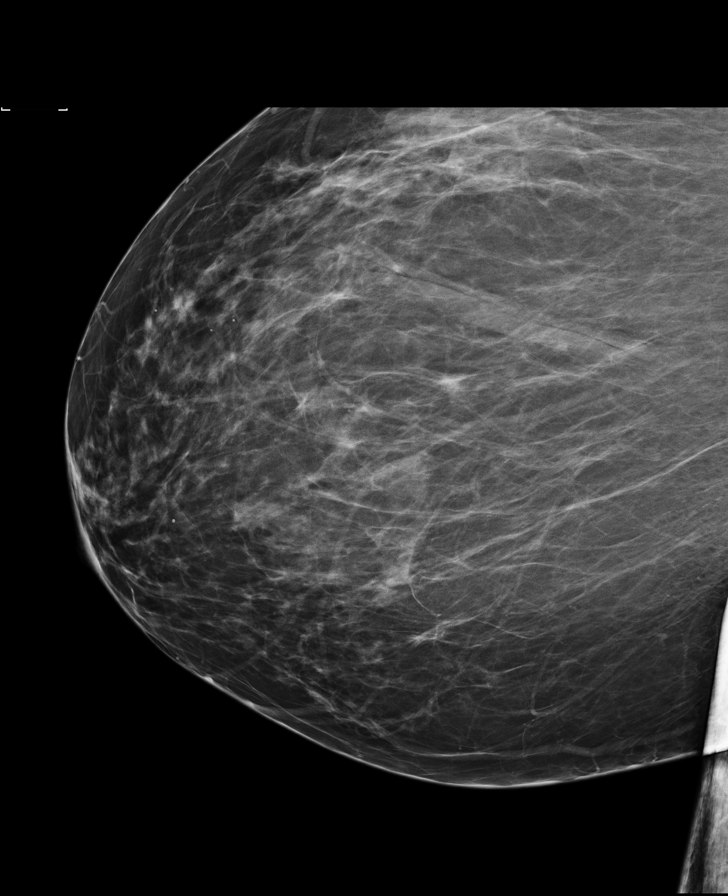

[8 of 8 positions shown; findings below may reference images not displayed]

ACR Breast Density Category b: There are scattered areas of
fibroglandular density.
FINDINGS: There are no findings suspicious for malignancy.
IMPRESSION: No mammographic evidence of malignancy. A result letter of this
screening mammogram will be mailed directly to the patient.

RECOMMENDATION:
Screening mammogram in one year. (Code:WO-V-ZRK)

BI-RADS CATEGORY  1: Negative.

## 2022-11-22 ENCOUNTER — Encounter: Payer: Self-pay | Admitting: Family Medicine

## 2022-11-22 ENCOUNTER — Ambulatory Visit (INDEPENDENT_AMBULATORY_CARE_PROVIDER_SITE_OTHER): Payer: PRIVATE HEALTH INSURANCE | Admitting: Family Medicine

## 2022-11-22 VITALS — BP 151/103 | HR 85 | Ht 64.0 in | Wt 327.2 lb

## 2022-11-22 DIAGNOSIS — J301 Allergic rhinitis due to pollen: Secondary | ICD-10-CM

## 2022-11-22 DIAGNOSIS — Z7984 Long term (current) use of oral hypoglycemic drugs: Secondary | ICD-10-CM

## 2022-11-22 DIAGNOSIS — E785 Hyperlipidemia, unspecified: Secondary | ICD-10-CM | POA: Diagnosis not present

## 2022-11-22 DIAGNOSIS — R202 Paresthesia of skin: Secondary | ICD-10-CM | POA: Diagnosis not present

## 2022-11-22 DIAGNOSIS — I10 Essential (primary) hypertension: Secondary | ICD-10-CM

## 2022-11-22 DIAGNOSIS — E1165 Type 2 diabetes mellitus with hyperglycemia: Secondary | ICD-10-CM

## 2022-11-22 LAB — POCT GLYCOSYLATED HEMOGLOBIN (HGB A1C): HbA1c, POC (controlled diabetic range): 7.4 % — AB (ref 0.0–7.0)

## 2022-11-22 MED ORDER — FLUTICASONE PROPIONATE 50 MCG/ACT NA SUSP: NASAL | 2 refills | Status: DC

## 2022-11-22 MED ORDER — GABAPENTIN 100 MG PO CAPS: 100.0000 mg | ORAL_CAPSULE | Freq: Three times a day (TID) | ORAL | 2 refills | Status: DC

## 2022-11-22 NOTE — Progress Notes (Unsigned)
    SUBJECTIVE:   CHIEF COMPLAINT / HPI:   Paresthesias Over the past 2 months, patient has noted intermittent tingling, sometimes needle-like, sensation in both of her feet, mostly around bottom of heel and toes. Also has intermittent tingling in her fingertips bilaterally. These sensations come and go every few days. The sensation occurs both during activity and at rest. She has been able to ambulate as per usual. No extremity swelling or skin changes. No recent injuries or falls. No recent long travel. Patient is a heavy smoker.   T2DM A1c of 7.4 in clinic today. Patient has been taking metformin 500 and atorvastatin 40, not taking glipizide. She notes having diarrhea about twice a week over the last year which she thinks may be from the medications.   HTN Patient is not taking olmesartan, and is not interested in taking any anti-hypertensive medications at this time. She would like to continue trying a low carb/salt diet and exercise before proceeding with any medication.   PERTINENT  PMH / PSH: T2DM, HTN, HLD, OSA  OBJECTIVE:   BP (!) 151/103   Pulse 85   Ht 5\' 4"  (1.626 m)   Wt (!) 327 lb 3.2 oz (148.4 kg)   SpO2 97%   BMI 56.16 kg/m   General: Well-appearing. Resting comfortably in room. CV: Normal S1/S2. No extra heart sounds. Warm and well-perfused. Pulm: Breathing comfortably on room air. CTAB. No increased WOB. Abd: Soft, non-distended. Ext: Normal microfilament test in bilateral feet and hands. Some decreased sensation to touch of L 5th toe. No pain to palpation of lower legs and feet bilaterally. No extremity erythema or swelling bilaterally. Normal upper and lower extremity strength and ROM. Normal distal LE pulses. Skin:  Warm, dry. Psych: Pleasant and appropriate.   ASSESSMENT/PLAN:   Paresthesias May be diabetic peripheral neuropathy vs PAD vs nerve compression. Most likely diabetic peripheral neuropathy given normal ROM and normal distal pulses.  - Gabapentin  100 TID ordered  - Consider PAD workup or EMG as indicated   T2DM Poorly controlled diabetes at this time, with A1c of 7.4 in clinic today.  - Increase metformin to 1000mg  daily  - BMP, urine microalb/cr ordered today to assess kidney function - Advised taking medications with food   HTN Patient with elevated BP in clinic today. Patients prefers to focus on lifestyle changes instead of medications at this time.    Refilled Flonase per patient request.   Return to clinic in 2 weeks for follow up.  Ivery Quale, MD [facility]

## 2022-11-22 NOTE — Patient Instructions (Signed)
Thank you for visiting clinic today - it is always our pleasure to care for you.  Today we discussed your diabetes and the tingling in your feet and fingers that you've been experiencing. Your A1c was elevated at 7.4 today. Please take two pills of your metformin per day (1000 mg total). We will also check some labs today and let you know as the results come back.  For your tingling pains, this may improve with better blood sugar control. I have also sent a prescription for Gabapentin to your pharmacy, which is a medication that can help with nerve pain.   Please schedule an appointment in 2 weeks to follow up on your symptoms.   Reach out any time with any questions or concerns you may have - we are here for you!  Ivery Quale, MD [facility] [phone]

## 2022-11-24 LAB — MICROALBUMIN / CREATININE URINE RATIO
Creatinine, Urine: 46 mg/dL
Microalb/Creat Ratio: <7 mg/g{creat} (ref 0–29)
Microalbumin, Urine: <3 ug/mL

## 2022-11-24 LAB — BASIC METABOLIC PANEL
BUN/Creatinine Ratio: 10 (ref 9–23)
BUN: 8 mg/dL (ref 6–24)
CO2: 27 mmol/L (ref 20–29)
Calcium: 9.7 mg/dL (ref 8.7–10.2)
Chloride: 93 mmol/L — ABNORMAL LOW (ref 96–106)
Creatinine, Ser: 0.77 mg/dL (ref 0.57–1.00)
Glucose: 125 mg/dL — ABNORMAL HIGH (ref 70–99)
Potassium: 3.7 mmol/L (ref 3.5–5.2)
Sodium: 138 mmol/L (ref 134–144)
eGFR: 93 mL/min/{1.73_m2} (ref >59)

## 2022-12-02 ENCOUNTER — Ambulatory Visit: Payer: PRIVATE HEALTH INSURANCE | Admitting: Family Medicine

## 2022-12-31 ENCOUNTER — Other Ambulatory Visit: Payer: Self-pay

## 2022-12-31 DIAGNOSIS — E782 Mixed hyperlipidemia: Secondary | ICD-10-CM

## 2022-12-31 MED ORDER — ATORVASTATIN CALCIUM 40 MG PO TABS: 40.0000 mg | ORAL_TABLET | Freq: Every day | ORAL | 2 refills | Status: DC

## 2022-12-31 MED ORDER — CETIRIZINE HCL 10 MG PO TABS: 10.0000 mg | ORAL_TABLET | Freq: Every day | ORAL | 2 refills | Status: DC | PRN

## 2023-02-25 ENCOUNTER — Other Ambulatory Visit: Payer: Self-pay

## 2023-02-25 MED ORDER — ESCITALOPRAM OXALATE 10 MG PO TABS: ORAL_TABLET | ORAL | 2 refills | Status: DC

## 2023-02-25 NOTE — Telephone Encounter (Signed)
 Chart reviewed. Rx refilled.

## 2023-04-11 ENCOUNTER — Telehealth: Payer: Self-pay

## 2023-04-11 NOTE — Telephone Encounter (Signed)
 Patient calls nurse line requesting refill on hydrochlorothiazide. Per chart review, hydrochlorothiazide was discontinued in April 2024. At last office visit, patient reported that she did not want to start Olmesartan and would like to focus on lifestyle changes.   Advised patient that we would need her to schedule BP follow up prior to meds being sent to pharmacy. Scheduled patient for follow up tomorrow with Dr. Ardyth Harps, PCP did not have availability this month. Instructed patient to bring all medications to appointment tomorrow.   Molly Prude, RN

## 2023-04-12 ENCOUNTER — Ambulatory Visit: Payer: PRIVATE HEALTH INSURANCE | Admitting: Family Medicine

## 2023-04-13 NOTE — Progress Notes (Deleted)
    SUBJECTIVE:   CHIEF COMPLAINT / HPI: BP f/u  HTN Previously was on hydrochlorothiazide which was discontinued  in April 2024. Last seen in 10/2022 and wanted to avoid antihypertensive and focus on lifestyle modifications. Currently on no medications for elevated BP.  PERTINENT  PMH / PSH: T2DM, Graves Disease  OBJECTIVE:   There were no vitals taken for this visit.  General: Awake and Alert in NAD HEENT: NCAT. Sclera anicteric. No rhinorrhea. Cardiovascular: RRR. No M/R/G Respiratory: CTAB, normal WOB on RA. No wheezing, crackles, rhonchi, or diminished breath sounds. Abdomen: Soft, non-tender, non-distended. Bowel sounds normoactive/hypoactive/hyperactive. *** Extremities: Able to move all extremities. No BLE edema, no deformities or significant joint findings. Skin: Warm and dry. No abrasions or rashes noted. Neuro: A&Ox***. No focal neurological deficits.  ASSESSMENT/PLAN:   No problem-specific Assessment & Plan notes found for this encounter.     Fortunato Curling, DO [place] [facility]

## 2023-04-14 ENCOUNTER — Ambulatory Visit: Payer: PRIVATE HEALTH INSURANCE | Admitting: Family Medicine

## 2023-04-14 DIAGNOSIS — I10 Essential (primary) hypertension: Secondary | ICD-10-CM

## 2023-04-14 DIAGNOSIS — E1165 Type 2 diabetes mellitus with hyperglycemia: Secondary | ICD-10-CM

## 2023-04-28 ENCOUNTER — Ambulatory Visit: Payer: PRIVATE HEALTH INSURANCE | Admitting: Family Medicine

## 2023-06-02 ENCOUNTER — Other Ambulatory Visit: Payer: Self-pay

## 2023-06-02 MED ORDER — PROPRANOLOL HCL 80 MG PO TABS: ORAL_TABLET | ORAL | 0 refills | Status: DC

## 2023-06-02 NOTE — Telephone Encounter (Signed)
 Called patient and advised that Dr. Fernand Howard refilled prescription.   Patient appreciative.   Elsie Halo, RN

## 2023-06-02 NOTE — Telephone Encounter (Signed)
 Chart reviewed. Rx refilled. Has upcoming appt 5/15, will adjust as indicated at that time.

## 2023-06-09 ENCOUNTER — Ambulatory Visit (INDEPENDENT_AMBULATORY_CARE_PROVIDER_SITE_OTHER): Payer: PRIVATE HEALTH INSURANCE | Admitting: Family Medicine

## 2023-06-09 ENCOUNTER — Other Ambulatory Visit: Payer: Self-pay | Admitting: Family Medicine

## 2023-06-09 ENCOUNTER — Encounter: Payer: Self-pay | Admitting: Family Medicine

## 2023-06-09 VITALS — BP 154/96 | HR 78 | Ht 64.0 in | Wt 317.8 lb

## 2023-06-09 DIAGNOSIS — E1165 Type 2 diabetes mellitus with hyperglycemia: Secondary | ICD-10-CM

## 2023-06-09 DIAGNOSIS — Z0000 Encounter for general adult medical examination without abnormal findings: Secondary | ICD-10-CM

## 2023-06-09 DIAGNOSIS — E0500 Thyrotoxicosis with diffuse goiter without thyrotoxic crisis or storm: Secondary | ICD-10-CM | POA: Diagnosis not present

## 2023-06-09 DIAGNOSIS — I10 Essential (primary) hypertension: Secondary | ICD-10-CM

## 2023-06-09 LAB — POCT GLYCOSYLATED HEMOGLOBIN (HGB A1C): HbA1c, POC (controlled diabetic range): 6.8 % (ref 0.0–7.0)

## 2023-06-09 MED ORDER — OLMESARTAN MEDOXOMIL 20 MG PO TABS: 20.0000 mg | ORAL_TABLET | Freq: Every day | ORAL | 2 refills | Status: DC

## 2023-06-09 MED ORDER — WEGOVY 0.25 MG/0.5ML ~~LOC~~ SOAJ: 0.2500 mg | SUBCUTANEOUS | 0 refills | Status: DC

## 2023-06-09 MED ORDER — METFORMIN HCL ER 500 MG PO TB24: 500.0000 mg | ORAL_TABLET | Freq: Every day | ORAL | 3 refills | Status: DC

## 2023-06-09 NOTE — Assessment & Plan Note (Signed)
 Last TSH 1.5 last April. S/p ablation x 2 per chart review. -TSH ordered today given >1 year since last check

## 2023-06-09 NOTE — Patient Instructions (Addendum)
 Thank you for visiting clinic today and allowing us  to participate in your care!  Your A1c was in a good range today. Please continue taking the metformin  as prescribed. We discussed starting Wegovy today.   Please take the olmesartan  medication daily for your blood pressure. You may purchase a BP kit online if you would like to check your pressures at home.   Please schedule an appointment in 3-4 weeks to follow up.   Reach out any time with any questions or concerns you may have - we are here for you!  Carey Chapman, MD [facility] [phone]

## 2023-06-09 NOTE — Assessment & Plan Note (Signed)
 Well-controlled A1c 6.8 today.  Patient BMI greater than 50.  Last lipid panel with LDL 63 last April.  Unremarkable microalbumin/creatinine ratio in October.  No personal or family history of thyroid cancer. -Start semaglutide 0.25 weekly -Continue metformin  500 daily -Continue atorvastatin  40 daily -Discontinue glipizide   -Discussed gabapentin  may help with neuropathic pain, though patient prefers to continue magnesium supplementation at this time -Reassuring foot monofilament today -Encouraged patient to see ophthalmologist for yearly diabetic eye exam

## 2023-06-09 NOTE — Progress Notes (Signed)
    SUBJECTIVE:   CHIEF COMPLAINT / HPI:   DM -Taking metformin  only, not glipizide  or gabapentin  -Prefers not to take medications if she can -Numbness and tingling in feet mostly, sometimes in fingertips. Recently started taking Mg supplement for neuropathy which has been helpful.  -Has been over year since last dm eye appt -Interested in Ozempic: No personal or family history of thyroid cancer  HTN - Not taking medications besides propranolol  for palpitations - Prefers not to take medications - No recent headache or extremity swelling - Reports some blurriness with her astigmatism, sees eye doctor  PERTINENT  PMH / PSH: T2DM, HTN, Graves' disease, OSA  OBJECTIVE:   BP (!) 153/101   Pulse 78   Ht 5\' 4"  (1.626 m)   Wt (!) 317 lb 12.8 oz (144.2 kg)   SpO2 93%   BMI 54.55 kg/m   General: Well-appearing. Resting comfortably in room. CV: Normal S1/S2. No extra heart sounds. Warm and well-perfused. Pulm: Breathing comfortably on room air. CTAB. No increased WOB. Abd: Soft, non-tender, non-distended. Skin:  Warm, dry.  Bilateral feet with intact monofilament testing.  Psych: Pleasant and appropriate.    ASSESSMENT/PLAN:   Assessment & Plan Type 2 diabetes mellitus with hyperglycemia, unspecified whether long term insulin use (HCC) Well-controlled A1c 6.8 today.  Patient BMI greater than 50.  Last lipid panel with LDL 63 last April.  Unremarkable microalbumin/creatinine ratio in October.  No personal or family history of thyroid cancer. -Start semaglutide 0.25 weekly -Continue metformin  500 daily -Continue atorvastatin  40 daily -Discontinue glipizide   -Discussed gabapentin  may help with neuropathic pain, though patient prefers to continue magnesium supplementation at this time -Reassuring foot monofilament today -Encouraged patient to see ophthalmologist for yearly diabetic eye exam Hypertension, unspecified type Elevated to 154/96 on repeat today.  Has not been taking  antihypertensives as prescribed. -Discussed and prescribed olmesartan  20 daily -Discussed ED precautions Graves disease Last TSH 1.5 last April. S/p ablation x 2 per chart review. -TSH ordered today given >1 year since last check  Healthcare maintenance Overdue for screening mammogram.  Ordered.   Return to clinic in 3-4 weeks for follow up.   Carey Chapman, MD [facility]

## 2023-06-10 ENCOUNTER — Telehealth: Payer: Self-pay

## 2023-06-10 ENCOUNTER — Ambulatory Visit: Payer: Self-pay | Admitting: Family Medicine

## 2023-06-10 LAB — TSH RFX ON ABNORMAL TO FREE T4: TSH: 2.45 u[IU]/mL (ref 0.450–4.500)

## 2023-06-10 MED ORDER — LANCET DEVICE MISC: 1.0000 | Freq: Three times a day (TID) | 0 refills | Status: AC

## 2023-06-10 MED ORDER — LANCETS MISC. MISC: 1.0000 | Freq: Three times a day (TID) | 0 refills | Status: AC

## 2023-06-10 MED ORDER — BLOOD GLUCOSE MONITORING SUPPL DEVI: 1.0000 | Freq: Three times a day (TID) | 0 refills | Status: AC

## 2023-06-10 MED ORDER — BLOOD GLUCOSE TEST VI STRP: 1.0000 | ORAL_STRIP | Freq: Three times a day (TID) | 0 refills | Status: AC

## 2023-06-10 NOTE — Telephone Encounter (Signed)
 Pharmacy Patient Advocate Encounter   Received notification from CoverMyMeds that prior authorization for OZEMPIC 0.25MG  is required/requested.   Insurance verification completed.   The patient is insured through [facility] .   PA required; PA submitted to above mentioned insurance via CoverMyMeds Key/confirmation #/EOC [employer]. Status is pending

## 2023-06-10 NOTE — Addendum Note (Signed)
 Addended by: Carey Chapman on: 06/10/2023 10:50 AM   Modules accepted: Orders

## 2023-06-10 NOTE — Telephone Encounter (Signed)
 Pharmacy Patient Advocate Encounter  Received notification from [facility] that Prior Authorization for OZEMPIC 0.25/0.5MG  has been APPROVED from 06/10/23 to 06/09/24

## 2023-06-30 ENCOUNTER — Other Ambulatory Visit: Payer: Self-pay | Admitting: Family Medicine

## 2023-06-30 NOTE — Telephone Encounter (Signed)
 Chart reviewed. Rx refilled.

## 2023-07-04 ENCOUNTER — Other Ambulatory Visit: Payer: Self-pay | Admitting: Family Medicine

## 2023-07-07 ENCOUNTER — Ambulatory Visit: Payer: PRIVATE HEALTH INSURANCE | Admitting: Family Medicine

## 2023-07-20 ENCOUNTER — Other Ambulatory Visit: Payer: Self-pay | Admitting: Family Medicine

## 2023-07-21 NOTE — Telephone Encounter (Signed)
 Chart reviewed.  Rx refilled.  Requesting patient to be seen in clinic.

## 2023-07-28 ENCOUNTER — Ambulatory Visit: Payer: PRIVATE HEALTH INSURANCE | Admitting: Family Medicine

## 2023-08-09 ENCOUNTER — Other Ambulatory Visit: Payer: Self-pay | Admitting: Family Medicine

## 2023-08-09 DIAGNOSIS — I10 Essential (primary) hypertension: Secondary | ICD-10-CM

## 2023-08-09 DIAGNOSIS — E1165 Type 2 diabetes mellitus with hyperglycemia: Secondary | ICD-10-CM

## 2023-08-29 ENCOUNTER — Other Ambulatory Visit: Payer: Self-pay | Admitting: Family Medicine

## 2023-08-29 DIAGNOSIS — E782 Mixed hyperlipidemia: Secondary | ICD-10-CM

## 2023-08-30 NOTE — Procedures (Signed)
 SABRA

## 2023-08-30 NOTE — Telephone Encounter (Signed)
 Chart reviewed. Rx refilled.

## 2023-09-07 ENCOUNTER — Ambulatory Visit: Payer: PRIVATE HEALTH INSURANCE | Admitting: Family Medicine

## 2023-09-12 ENCOUNTER — Ambulatory Visit (INDEPENDENT_AMBULATORY_CARE_PROVIDER_SITE_OTHER): Payer: PRIVATE HEALTH INSURANCE | Admitting: Family Medicine

## 2023-09-12 ENCOUNTER — Encounter: Payer: Self-pay | Admitting: Family Medicine

## 2023-09-12 VITALS — BP 166/101 | HR 91 | Ht 64.0 in | Wt 307.4 lb

## 2023-09-12 DIAGNOSIS — F419 Anxiety disorder, unspecified: Secondary | ICD-10-CM | POA: Diagnosis not present

## 2023-09-12 DIAGNOSIS — E1165 Type 2 diabetes mellitus with hyperglycemia: Secondary | ICD-10-CM | POA: Diagnosis not present

## 2023-09-12 DIAGNOSIS — I10 Essential (primary) hypertension: Secondary | ICD-10-CM

## 2023-09-12 LAB — POCT GLYCOSYLATED HEMOGLOBIN (HGB A1C): HbA1c, POC (controlled diabetic range): 5.9 % (ref 0.0–7.0)

## 2023-09-12 MED ORDER — OZEMPIC (0.25 OR 0.5 MG/DOSE) 2 MG/3ML ~~LOC~~ SOPN: 0.5000 mg | PEN_INJECTOR | SUBCUTANEOUS | 1 refills | Status: DC

## 2023-09-12 MED ORDER — ESCITALOPRAM OXALATE 20 MG PO TABS: 20.0000 mg | ORAL_TABLET | Freq: Every day | ORAL | 1 refills | Status: DC

## 2023-09-12 MED ORDER — OLMESARTAN MEDOXOMIL 40 MG PO TABS: 40.0000 mg | ORAL_TABLET | Freq: Every day | ORAL | 3 refills | Status: DC

## 2023-09-12 NOTE — Patient Instructions (Addendum)
 Thank you for visiting clinic today and allowing us  to participate in your care!  1.Your A1c is well-controlled today. Please continue taking metformin  500 daily. We increased your Ozempic  to 0.5 mg weekly. We placed a referral to the eye doctor for you- please see them every year for diabetes eye care.   2. Your blood pressure was high today. We increased your medication to olmesartan  40 mg daily.   3. We increased your Lexapro  medication to 20 mg daily to try and help with mood. See below for therapy resources.   4. Call  [phone] to get your mammogram done.   5. Go to your local pharmacy to get your Shingles and pneumococcal vaccines.   Please schedule an appointment in 1 week for follow up on your blood pressure and mood.   Reach out any time with any questions or concerns you may have - we are here for you!  Damien Cassis, MD [facility] [phone]   Therapy and Counseling Resources Most providers on this list will take Medicaid. Patients with commercial insurance or Medicare should contact their insurance company to get a list of in network providers.  Best[facility]:Psychiatry and Counseling [address] [facility] [facility] [address] [place], [place] [address] [phone]  [facility]  [address] Suite [place], [place] [address]      [phone]  [facility] [address]  [place], [place] [address] [phone]  [facility] [address]  [place], [place] [address]       [phone]     [facility] (virtual only) [phone]  Jan[facility] Griff[facility] [address], [place], [place] [phone]  [facility]:  [address] [place] [phone]  [facility]:  [address] [place] [phone]  [facility] (under & uninsured) [address]   [place] [phone]    [email or web address]      [facility]al Health [address]  [place]    [phone]  [facility] -[address]     Phone:  [phone]     [facility]-  [address]  [phone]   [facility] [address]      [place], [place] [phone]  [facility]: [address] [place], [place], [place]  [phone]   [facility] (Spanish therapist) [email or web address]  [address] [place]  [address]   [place] [address]    [phone]    [facility]   [address],  [place], [place]  [phone]   [facility]  [address] [place]  [phone]  [facility]  [address] [place], [place]        [phone]  Open Access/Walk In Clinic under & uninsured  [facility]  [address] [place], [place] [phone] Crisis [phone]  [facility] [address],  (Spanish)   [address] , [place]: [phone]) 8:30 - 12; 1 - 2:30  [facility] [employer],  [address] [place], [place]    [phone]):8:30 - 12; 2 - 3PM  [facility] [employer],  [address],  [place]; [phone]):   Mon - Fri 8 AM - 5 PM  [facility] [address] [place]  [place]  12:30 to 3:00 or call to schedule an appointment  [phone]  Specific Provider options Psychology Today  [email or web address].[email or web address]/us  click on find a therapist  enter your zip code left side and select or tailor a therapist for your specific need.   [facility] Provider Directory [email or web address]  (Medicaid)   Follow all drop down to find a provider  Social Support program Mental Health [place] [phone] or [employer] [address], [place], [place] Recovery support and educational   24- Hour Availability:   [facility]  [address] [place], [place] [phone] Crisis [phone]  [facility] [phone]  [place] [facility]  [phone]   [facility]  [phone] (after hours)  Therapeutic Alternative/Mobile Crisis   [phone]  [place]  National Suicide Hotline  470-586-6098 [place])  Call 911 or go to emergency room  [facility]  [phone]);  [place] and [employer]  [phone]); [place], [place], [place], [place], [place], [place], [place]

## 2023-09-12 NOTE — Progress Notes (Signed)
    SUBJECTIVE:   CHIEF COMPLAINT / HPI:   T2DM -Been taking: -Metformin  500 daily  -Ozempic  0.25 weekly - no N/V/abdominal pain    HTN -Been taking: -Olmesartan  20 daily  -Propanolol 80 daily  -No HA, VC, CP, SOB, or extremity swelling   Mood/Anxiety -Been taking Lexapro  10 daily, would like increase in dosage  -Feeling less interested, less enjoyment recently, more fatigue  -No SI/HI -Open to therapy   PERTINENT  PMH / PSH: T2DM, HTN, Anxiety   OBJECTIVE:   BP (!) 166/101   Pulse 91   Ht 5' 4 (1.626 m)   Wt (!) 307 lb 6 oz (139.4 kg)   SpO2 100%   BMI 52.76 kg/m   General: Well-appearing. Resting comfortably in room. CV: Normal S1/S2. No extra heart sounds. Warm and well-perfused. Pulm: Breathing comfortably on room air. CTAB. No increased WOB. Abd: Soft, non-tender, non-distended. Skin:  Warm, dry. Psych: Pleasant and appropriate.   Flowsheet Row Office Visit from 06/09/2023 in [facility] [place]  PHQ-9 Total Score 2     ASSESSMENT/PLAN:   Assessment & Plan Type 2 diabetes mellitus with hyperglycemia, unspecified whether long term insulin use (HCC) A1c well-controlled at 5.9 today. Annual BMP, ACR, foot exam completed previously within last year.  - Cont metformin  500 daily  - Increase to Ozempic  0.5 weekly  - Discussed and placed referral for ophthalmologist  HYPERTENSION, BENIGN SYSTEMIC Remains elevated on repeat today to 166/101. Asymptomatic.  - Discussed increasing to olmesartan  40 daily  - Plan to check BMP in 1 week  - Return precautions discussed  Anxiety Recent worsening of mood. No SI/HI. PHQ9 score of 2 today.  - Discussed increasing Lexapro  to 20 mg daily  - Discussed and provided therapy resources    RTC in 1 week for BP and mood FU.   Damien Cassis, MD [facility]

## 2023-09-12 NOTE — Assessment & Plan Note (Signed)
 Remains elevated on repeat today to 166/101. Asymptomatic.  - Discussed increasing to olmesartan  40 daily  - Plan to check BMP in 1 week  - Return precautions discussed

## 2023-09-12 NOTE — Assessment & Plan Note (Signed)
 Recent worsening of mood. No SI/HI. PHQ9 score of 2 today.  - Discussed increasing Lexapro  to 20 mg daily  - Discussed and provided therapy resources

## 2023-09-12 NOTE — Assessment & Plan Note (Signed)
 A1c well-controlled at 5.9 today. Annual BMP, ACR, foot exam completed previously within last year.  - Cont metformin  500 daily  - Increase to Ozempic  0.5 weekly  - Discussed and placed referral for ophthalmologist

## 2023-09-14 ENCOUNTER — Other Ambulatory Visit: Payer: Self-pay | Admitting: Family Medicine

## 2023-09-14 NOTE — Telephone Encounter (Signed)
 Chart reviewed. Rx refilled.

## 2023-09-18 ENCOUNTER — Other Ambulatory Visit: Payer: Self-pay | Admitting: Family Medicine

## 2023-09-19 ENCOUNTER — Ambulatory Visit (INDEPENDENT_AMBULATORY_CARE_PROVIDER_SITE_OTHER): Payer: PRIVATE HEALTH INSURANCE | Admitting: Family Medicine

## 2023-09-19 ENCOUNTER — Encounter: Payer: Self-pay | Admitting: Family Medicine

## 2023-09-19 VITALS — BP 159/113 | HR 87 | Ht 64.0 in | Wt 306.2 lb

## 2023-09-19 DIAGNOSIS — I10 Essential (primary) hypertension: Secondary | ICD-10-CM | POA: Diagnosis not present

## 2023-09-19 MED ORDER — AMLODIPINE BESYLATE 5 MG PO TABS: 5.0000 mg | ORAL_TABLET | Freq: Every day | ORAL | 3 refills | Status: DC

## 2023-09-19 NOTE — Progress Notes (Unsigned)
    SUBJECTIVE:   CHIEF COMPLAINT / HPI:   HTN No CP/SOB/extremity swelling/VC Non dizziness/lightheadedness  Takes propanolol for palpitaions    PERTINENT  PMH / PSH: ***  OBJECTIVE:   BP (!) 159/113 (BP Location: Left Arm, Patient Position: Sitting)   Pulse 87   Ht 5' 4 (1.626 m)   Wt (!) 306 lb 3.2 oz (138.9 kg)   SpO2 96%   BMI 52.56 kg/m   General: Well-appearing. Resting comfortably in room. CV: Normal S1/S2. No extra heart sounds. Warm and well-perfused. Pulm: Breathing comfortably on room air. CTAB. No increased WOB. Skin:  Warm, dry. No significant extremity swelling.  Psych: Pleasant and appropriate.    ASSESSMENT/PLAN:   Assessment & Plan Essential hypertension, benign      Damien Cassis, MD [facility]

## 2023-09-19 NOTE — Patient Instructions (Addendum)
 Thank you for visiting clinic today and allowing us  to participate in your care!  Your blood pressure was still high today. We started a new medication called amlodipine  today. Please continue taking all your other medications as previously prescribed.   We collected labwork today and I will contact you with the results.   Please schedule an appointment in 3 weeks for Ozempic  follow up.   Reach out any time with any questions or concerns you may have - we are here for you!  Damien Cassis, MD [facility] [phone]

## 2023-09-20 LAB — BASIC METABOLIC PANEL WITH GFR
BUN/Creatinine Ratio: 9 (ref 9–23)
BUN: 9 mg/dL (ref 6–24)
CO2: 29 mmol/L (ref 20–29)
Calcium: 10.5 mg/dL — ABNORMAL HIGH (ref 8.7–10.2)
Chloride: 96 mmol/L (ref 96–106)
Creatinine, Ser: 0.99 mg/dL (ref 0.57–1.00)
Glucose: 115 mg/dL — ABNORMAL HIGH (ref 70–99)
Potassium: 4.4 mmol/L (ref 3.5–5.2)
Sodium: 140 mmol/L (ref 134–144)
eGFR: 68 mL/min/1.73 (ref >59)

## 2023-09-20 NOTE — Assessment & Plan Note (Signed)
 Elevated on repeat today to 159/113. Patient asymptomatic.  -Discussed addition of amlodipine  5 daily to regimen -Continue olmesartan  40 daily, propanolol 80 daily  -BMP today  -ED precautions discussed

## 2023-09-21 ENCOUNTER — Ambulatory Visit: Payer: Self-pay | Admitting: Family Medicine

## 2023-09-23 ENCOUNTER — Ambulatory Visit: Payer: Self-pay

## 2023-09-23 NOTE — Progress Notes (Deleted)
    SUBJECTIVE:   CHIEF COMPLAINT / HPI:   HTN Note recent visit on 8/29 patient was continued on olmesartan  40 mg daily, propranolol  80 mg daily Amlodipine  5 mg daily was added BMP with slight hypercalcemia 10.5 but normal renal function  PERTINENT  PMH / PSH: ***  OBJECTIVE:   There were no vitals taken for this visit.  ***  ASSESSMENT/PLAN:   Assessment & Plan Essential hypertension, benign  Hypercalcemia    Payton Coward, MD [facility]

## 2023-09-28 ENCOUNTER — Ambulatory Visit: Payer: Self-pay | Admitting: Family Medicine

## 2023-10-30 ENCOUNTER — Other Ambulatory Visit: Payer: Self-pay | Admitting: Family Medicine

## 2023-10-31 ENCOUNTER — Other Ambulatory Visit: Payer: Self-pay | Admitting: *Deleted

## 2023-11-01 MED ORDER — METFORMIN HCL ER 500 MG PO TB24: 500.0000 mg | ORAL_TABLET | Freq: Every day | ORAL | 3 refills | Status: AC

## 2023-11-01 NOTE — Telephone Encounter (Signed)
 Chart reviewed. Rx refilled. Requesting patient follow up prior to next refill.

## 2023-11-01 NOTE — Telephone Encounter (Signed)
 Chart reviewed. Metformin  refilled.

## 2023-11-17 ENCOUNTER — Other Ambulatory Visit: Payer: Self-pay | Admitting: Family Medicine

## 2023-11-17 NOTE — Telephone Encounter (Signed)
 Chart reviewed. Rx refilled.

## 2023-11-24 NOTE — Progress Notes (Deleted)
    SUBJECTIVE:   CHIEF COMPLAINT / HPI:   HTN  T2DM  PERTINENT  PMH / PSH: ***  OBJECTIVE:   There were no vitals taken for this visit.  ***  ASSESSMENT/PLAN:   Assessment & Plan      Damien Cassis, MD [facility]

## 2023-11-25 ENCOUNTER — Ambulatory Visit: Payer: PRIVATE HEALTH INSURANCE | Admitting: Family Medicine

## 2023-11-29 ENCOUNTER — Ambulatory Visit: Payer: PRIVATE HEALTH INSURANCE | Admitting: Family Medicine

## 2023-11-29 NOTE — Progress Notes (Deleted)
    SUBJECTIVE:   CHIEF COMPLAINT / HPI:   HTN  T2DM  PERTINENT  PMH / PSH: ***  OBJECTIVE:   There were no vitals taken for this visit.  ***  ASSESSMENT/PLAN:   Assessment & Plan      Damien Cassis, MD [facility]

## 2023-12-01 ENCOUNTER — Other Ambulatory Visit: Payer: Self-pay | Admitting: Family Medicine

## 2023-12-02 ENCOUNTER — Other Ambulatory Visit: Payer: Self-pay | Admitting: Family Medicine

## 2023-12-02 MED ORDER — ESCITALOPRAM OXALATE 20 MG PO TABS: 20.0000 mg | ORAL_TABLET | Freq: Every day | ORAL | 1 refills | Status: AC

## 2023-12-02 NOTE — Progress Notes (Signed)
 Chart reviewed. Rx refilled.

## 2023-12-13 ENCOUNTER — Ambulatory Visit: Payer: PRIVATE HEALTH INSURANCE | Admitting: Family Medicine

## 2023-12-13 ENCOUNTER — Encounter: Payer: Self-pay | Admitting: Family Medicine

## 2023-12-13 VITALS — BP 143/91 | HR 88 | Ht 64.0 in | Wt 306.0 lb

## 2023-12-13 DIAGNOSIS — I10 Essential (primary) hypertension: Secondary | ICD-10-CM | POA: Diagnosis not present

## 2023-12-13 DIAGNOSIS — E1165 Type 2 diabetes mellitus with hyperglycemia: Secondary | ICD-10-CM | POA: Diagnosis not present

## 2023-12-13 LAB — POCT GLYCOSYLATED HEMOGLOBIN (HGB A1C): HbA1c, POC (controlled diabetic range): 6.1 % (ref 0.0–7.0)

## 2023-12-13 MED ORDER — SEMAGLUTIDE (1 MG/DOSE) 4 MG/3ML ~~LOC~~ SOPN: 1.0000 mg | PEN_INJECTOR | SUBCUTANEOUS | 5 refills | Status: AC

## 2023-12-13 MED ORDER — AMLODIPINE BESYLATE 10 MG PO TABS
10.0000 mg | ORAL_TABLET | Freq: Every day | ORAL | 3 refills | Status: AC
Start: 2023-12-13 — End: ?

## 2023-12-13 NOTE — Progress Notes (Unsigned)
    SUBJECTIVE:   CHIEF COMPLAINT / HPI:   T2DM -Presenting today for diabetes follow up  -Reports recent malfunctions in ozempic  pens, not always able to dispense -Has been taking ozempic  0.5 weekly without unwanted side effects, no N/V or abdominal pain  -Also taking metformin  500 daily  HTN -Current regimen includes olmesartan  40 daily and amlodipine  5 daily -No recent CP, SOB, HA  PERTINENT  PMH / PSH: T2DM, HTN, HLD, Graves disease, ODA  OBJECTIVE:   BP (!) 143/91   Pulse 88   Ht 5' 4 (1.626 m)   Wt (!) 306 lb (138.8 kg)   SpO2 100%   BMI 52.52 kg/m   General: No acute distress. Resting comfortably in room. CV: S1/S2. No extra heart sounds. Warm and well-perfused. Pulm: Breathing comfortably on room air. CTAB. No increased WOB. Abd: Soft, non-tender, non-distended. Skin:  Warm, dry. Psych: Pleasant and appropriate.    ASSESSMENT/PLAN:   Assessment & Plan Type 2 diabetes mellitus with hyperglycemia, unspecified whether long term insulin use (HCC) A1c well controlled today at 6.1.  - Discussed increase to ozempic  1mg /week with refills available  - Encouraged patient to bring ozempic  pen dispensers with her to next visit to further investigate possible malfunction - UACR, lipid panel today Hypertension BP elevated on repeat today. Asymptomatic.  - Increase to amlodipine  10 daily - Continue olmesartan  40 daily  - Precautions discussed   RTC in 1 month for follow up.   Damien Cassis, MD [facility]

## 2023-12-13 NOTE — Patient Instructions (Addendum)
 Thank you for visiting clinic today and allowing us  to participate in your care!  We increased your Ozempic  dose to 1mg  per week. If you have any issues with refills, please contact your pharmacy and then let us  know.   Your blood pressure is a bit higher than goal range. We increased your amlodipine  to 10 mg daily. If you feel dizzy or lightheaded, please return to 5 mg daily and let us  know.   Please come back in 1 month for follow up:  Future Appointments  Date Time Provider Department Center  01/02/2024 10:10 AM Diona Perkins, MD [facility]   Reach out any time with any questions or concerns you may have - we are here for you!  Perkins Diona, MD [facility] [phone]

## 2023-12-14 LAB — LIPID PANEL
Chol/HDL Ratio: 2.3 ratio (ref 0.0–4.4)
Cholesterol, Total: 140 mg/dL (ref 100–199)
HDL: 60 mg/dL (ref >39)
LDL Chol Calc (NIH): 60 mg/dL (ref 0–99)
Triglycerides: 113 mg/dL (ref 0–149)
VLDL Cholesterol Cal: 20 mg/dL (ref 5–40)

## 2023-12-14 LAB — MICROALBUMIN / CREATININE URINE RATIO
Creatinine, Urine: 92.9 mg/dL
Microalb/Creat Ratio: 5 mg/g{creat} (ref 0–29)
Microalbumin, Urine: 4.2 ug/mL

## 2023-12-15 ENCOUNTER — Ambulatory Visit: Payer: Self-pay | Admitting: Family Medicine

## 2023-12-15 NOTE — Assessment & Plan Note (Signed)
 BP elevated on repeat today. Asymptomatic.  - Increase to amlodipine  10 daily - Continue olmesartan  40 daily  - Precautions discussed

## 2023-12-15 NOTE — Assessment & Plan Note (Signed)
 A1c well controlled today at 6.1.  - Discussed increase to ozempic  1mg /week with refills available  - Encouraged patient to bring ozempic  pen dispensers with her to next visit to further investigate possible malfunction - UACR, lipid panel today

## 2023-12-18 ENCOUNTER — Other Ambulatory Visit: Payer: Self-pay | Admitting: Family Medicine

## 2023-12-18 DIAGNOSIS — J301 Allergic rhinitis due to pollen: Secondary | ICD-10-CM

## 2024-01-02 ENCOUNTER — Ambulatory Visit: Payer: Self-pay | Admitting: Family Medicine

## 2024-01-02 NOTE — Progress Notes (Deleted)
    SUBJECTIVE:   CHIEF COMPLAINT / HPI:   T2DM Last A1c in Nov, well-controlled Ozempic  pens *** Optho?   PERTINENT  PMH / PSH: ***  OBJECTIVE:   There were no vitals taken for this visit.  ***  ASSESSMENT/PLAN:   Assessment & Plan      Molly Cassis, MD [facility]
